# Patient Record
Sex: Female | Born: 1975 | Race: Black or African American | Hispanic: No | Marital: Married | Smoking: Never smoker
Health system: Southern US, Community
[De-identification: ages and names within clinical notes are randomized; demographics above are authoritative.]

## PROBLEM LIST (undated history)

## (undated) DIAGNOSIS — G35 Multiple sclerosis: Secondary | ICD-10-CM

## (undated) DIAGNOSIS — R011 Cardiac murmur, unspecified: Secondary | ICD-10-CM

## (undated) HISTORY — DX: Cardiac murmur, unspecified: R01.1

## (undated) HISTORY — DX: Multiple sclerosis: G35

## (undated) HISTORY — PX: BREAST SURGERY: SHX581

---

## 2008-04-08 ENCOUNTER — Encounter: Admission: RE | Admit: 2008-04-08 | Discharge: 2008-04-08 | Payer: Self-pay | Admitting: Family Medicine

## 2008-10-28 ENCOUNTER — Emergency Department (HOSPITAL_COMMUNITY): Admission: EM | Admit: 2008-10-28 | Discharge: 2008-10-28 | Payer: Self-pay | Admitting: Emergency Medicine

## 2009-12-22 ENCOUNTER — Emergency Department (HOSPITAL_COMMUNITY): Admission: EM | Admit: 2009-12-22 | Discharge: 2009-12-22 | Payer: Self-pay | Admitting: Emergency Medicine

## 2011-11-04 ENCOUNTER — Ambulatory Visit (HOSPITAL_COMMUNITY)
Admission: RE | Admit: 2011-11-04 | Discharge: 2011-11-04 | Disposition: A | Discharge status: Home / Self Care | Payer: BC Managed Care – PPO | Source: Ambulatory Visit | Attending: Emergency Medicine | Admitting: Emergency Medicine

## 2011-11-04 ENCOUNTER — Ambulatory Visit (INDEPENDENT_AMBULATORY_CARE_PROVIDER_SITE_OTHER): Payer: BC Managed Care – PPO | Admitting: Emergency Medicine

## 2011-11-04 VITALS — BP 129/87 | HR 71 | Temp 98.2°F | Resp 16 | Ht 64.25 in | Wt 145.2 lb

## 2011-11-04 DIAGNOSIS — R200 Anesthesia of skin: Secondary | ICD-10-CM

## 2011-11-04 DIAGNOSIS — R209 Unspecified disturbances of skin sensation: Secondary | ICD-10-CM

## 2011-11-04 LAB — COMPREHENSIVE METABOLIC PANEL
Albumin: 4.1 g/dL (ref 3.5–5.2)
BUN: 10 mg/dL (ref 6–23)
CO2: 26 mEq/L (ref 19–32)
Chloride: 105 mEq/L (ref 96–112)
Glucose, Bld: 90 mg/dL (ref 70–99)
Potassium: 3.5 mEq/L (ref 3.5–5.3)
Sodium: 139 mEq/L (ref 135–145)
Total Bilirubin: 1.3 mg/dL — ABNORMAL HIGH (ref 0.3–1.2)

## 2011-11-04 LAB — POCT CBC
Granulocyte percent: 48.2 %G (ref 37–80)
HCT, POC: 36.8 % — AB (ref 37.7–47.9)
Hemoglobin: 11.9 g/dL — AB (ref 12.2–16.2)
MCHC: 32.3 g/dL (ref 31.8–35.4)
MCV: 88.5 fL (ref 80–97)
MPV: 9.6 fL (ref 0–99.8)
POC LYMPH PERCENT: 42.8 %L (ref 10–50)
POC MID %: 9 %M (ref 0–12)

## 2011-11-04 LAB — TSH: TSH: 1.871 u[IU]/mL (ref 0.350–4.500)

## 2011-11-04 LAB — GLUCOSE, POCT (MANUAL RESULT ENTRY): POC Glucose: 82

## 2011-11-04 NOTE — Progress Notes (Signed)
Addended by: Lesle Chris A on: 11/04/2011 09:57 AM   Modules accepted: Orders

## 2011-11-04 NOTE — Progress Notes (Signed)
  Subjective:    Patient ID: Dawn Buckley, female    DOB: April 16, 1976, 36 y.o.   MRN: [id number]  HPI  patient enters with numbness which began in her left arm.She. Also noticed numbness on left side of her chest wall. She denies any weakness. She states she has not been having any headache double vision blurred vision facial asymmetry or other neurological symptoms. The left side of her abdomen also feels nunb.    Review of Systems      Objective:   Physical Exam  Constitutional: She appears well-developed and well-nourished.  Eyes: Pupils are equal, round, and reactive to light.  Neck: No thyromegaly present.  Cardiovascular: Normal rate.   Pulmonary/Chest: Effort normal and breath sounds normal.  Abdominal: She exhibits no distension.  Neurological: She is alert. She has normal reflexes. She displays normal reflexes. No cranial nerve deficit. She exhibits normal muscle tone. Coordination normal.       Patient has decreased sensation to touch which extends along the left arm left shoulder along the left lateral breast area and to the left side of the abdomen.  Skin: Skin is dry.   Results for orders placed in visit on 11/04/11  POCT CBC      Component Value Range   WBC 4.0 (*) 4.6 - 10.2 (K/uL)   Lymph, poc 1.7  0.6 - 3.4    POC LYMPH PERCENT 42.8  10 - 50 (%L)   MID (cbc) 0.4  0 - 0.9    POC MID % 9.0  0 - 12 (%M)   POC Granulocyte 1.9 (*) 2 - 6.9    Granulocyte percent 48.2  37 - 80 (%G)   RBC 4.16  4.04 - 5.48 (M/uL)   Hemoglobin 11.9 (*) 12.2 - 16.2 (g/dL)   HCT, POC 69.6 (*) 29.5 - 47.9 (%)   MCV 88.5  80 - 97 (fL)   MCH, POC 28.6  27 - 31.2 (pg)   MCHC 32.3  31.8 - 35.4 (g/dL)   RDW, POC 28.4     Platelet Count, POC 229  142 - 424 (K/uL)   MPV 9.6  0 - 99.8 (fL)  GLUCOSE, POCT (MANUAL RESULT ENTRY)      Component Value Range   POC Glucose 82         Assessment & Plan:     patient enters with left-sided numbness. She has numbness of her left arm and also left  side of the chest and abdomen. No other focal neurological findings were elicited. She has not had any pain and no rash making shingles unlikely. We'll proceed with routine blood work and scan.

## 2011-11-04 NOTE — Patient Instructions (Signed)
Please go to the [facility] for CT head. Please wait in x-ray for results of your scan.

## 2011-11-05 ENCOUNTER — Other Ambulatory Visit: Payer: Self-pay | Admitting: Emergency Medicine

## 2011-11-05 ENCOUNTER — Emergency Department (HOSPITAL_COMMUNITY): Payer: BC Managed Care – PPO

## 2011-11-05 ENCOUNTER — Encounter (HOSPITAL_COMMUNITY): Payer: Self-pay | Admitting: Emergency Medicine

## 2011-11-05 ENCOUNTER — Emergency Department (HOSPITAL_COMMUNITY)
Admission: EM | Admit: 2011-11-05 | Discharge: 2011-11-06 | Discharge status: Against Medical Advice | Payer: BC Managed Care – PPO | Attending: Emergency Medicine | Admitting: Emergency Medicine

## 2011-11-05 ENCOUNTER — Other Ambulatory Visit: Payer: Self-pay

## 2011-11-05 DIAGNOSIS — R209 Unspecified disturbances of skin sensation: Secondary | ICD-10-CM | POA: Insufficient documentation

## 2011-11-05 DIAGNOSIS — M25519 Pain in unspecified shoulder: Secondary | ICD-10-CM | POA: Insufficient documentation

## 2011-11-05 DIAGNOSIS — M79609 Pain in unspecified limb: Secondary | ICD-10-CM | POA: Insufficient documentation

## 2011-11-05 DIAGNOSIS — M79602 Pain in left arm: Secondary | ICD-10-CM

## 2011-11-05 LAB — CBC
HCT: 35.8 % — ABNORMAL LOW (ref 36.0–46.0)
MCHC: 35.5 g/dL (ref 30.0–36.0)
MCV: 84.6 fL (ref 78.0–100.0)
RBC: 4.23 MIL/uL (ref 3.87–5.11)

## 2011-11-05 LAB — DIFFERENTIAL
Basophils Relative: 1 % (ref 0–1)
Lymphocytes Relative: 44 % (ref 12–46)
Lymphs Abs: 2.6 10*3/uL (ref 0.7–4.0)
Neutro Abs: 2.8 10*3/uL (ref 1.7–7.7)
Neutrophils Relative %: 46 % (ref 43–77)

## 2011-11-05 LAB — BASIC METABOLIC PANEL
CO2: 27 mEq/L (ref 19–32)
Chloride: 103 mEq/L (ref 96–112)
Creatinine, Ser: 0.82 mg/dL (ref 0.50–1.10)
GFR calc non Af Amer: >90 mL/min (ref >90)
Glucose, Bld: 90 mg/dL (ref 70–99)

## 2011-11-05 LAB — URINALYSIS, ROUTINE W REFLEX MICROSCOPIC
Ketones, ur: NEGATIVE mg/dL
Leukocytes, UA: NEGATIVE

## 2011-11-05 LAB — POCT PREGNANCY, URINE: Preg Test, Ur: NEGATIVE

## 2011-11-05 LAB — URINE MICROSCOPIC-ADD ON

## 2011-11-05 MED ORDER — DIAZEPAM 5 MG/ML IJ SOLN: 5.0000 mg | Freq: Once | INTRAMUSCULAR | Status: DC

## 2011-11-05 NOTE — ED Provider Notes (Signed)
History     CSN: [id number]  Arrival date & time 11/05/11  1308   First MD Initiated Contact with Patient 11/05/11 2156      Chief Complaint  Patient presents with  . Arm Pain    (Consider location/radiation/quality/duration/timing/severity/associated sxs/prior treatment) HPI  Patient presents to the ED with complaints of right hand, arm, shoulder and rib numbness and tingling without pain for 3 days. The patient states that most of the symptoms have resolved but that her arm still feels tight. She denies doing any physical activity recently, or doing anything outside of the norm. The patient denies having neck pain, headache or chest pains. She states that she did have some hand pain but it was a shooting pain and that went away. She denies weakness in any of these extremities. She decided to come in today to get it looked at because she is unsure of what is causing her symptoms.   History reviewed. No pertinent past medical history.  History reviewed. No pertinent past surgical history.  No family history on file.  History  Substance Use Topics  . Smoking status: Never Smoker   . Smokeless tobacco: Not on file  . Alcohol Use: Not on file    OB History    Grav Para Term Preterm Abortions TAB SAB Ect Mult Living                  Review of Systems   HEENT: denies blurry vision or change in hearing PULMONARY: Denies difficulty breathing and SOB CARDIAC: denies chest pain or heart palpitations MUSCULOSKELETAL:  denies being unable to ambulate ABDOMEN AL: denies abdominal pain GU: denies loss of bowel or urinary control NEURO: denies weakness  Allergies  Codeine and Sulfa antibiotics  Home Medications   Current Outpatient Rx  Name Route Sig Dispense Refill  . ADULT MULTIVITAMIN W/MINERALS CH Oral Take 1 tablet by mouth daily.    Azzie Roup ACE-ETH ESTRAD-FE 1-20 MG-MCG PO TABS Oral Take 1 tablet by mouth daily.      BP 141/86  Pulse 74  Temp(Src) 98.7 F (37.1  C) (Oral)  Resp 18  SpO2 100%  LMP 10/15/2011  Physical Exam  Nursing note and vitals reviewed. Constitutional: She appears well-developed and well-nourished. No distress.  HENT:  Head: Normocephalic and atraumatic.  Eyes: Pupils are equal, round, and reactive to light.  Neck: Normal range of motion. Neck supple.  Cardiovascular: Normal rate and regular rhythm.   Pulmonary/Chest: Effort normal. Chest wall is not dull to percussion. She exhibits no mass, no tenderness, no laceration, no crepitus and no retraction.    Abdominal: Soft.  Musculoskeletal:       Left shoulder: She exhibits normal range of motion, no tenderness, no bony tenderness, no swelling, no effusion, no crepitus, no deformity, no laceration, no pain, no spasm, normal pulse and normal strength.        Equal strength to bilateral upper extremities. Neurosensory function adequate to both arms. Skin color is normal. Skin is warm and moist. I see no step off deformity in the neck, no bony tenderness. Pt is able to ambulate without limp.No crepitus, laceration, effusion, swelling.  Pulses are normal   Neurological: She is alert.  Skin: Skin is warm and dry.    ED Course  Procedures (including critical care time)  Labs Reviewed  URINALYSIS, ROUTINE W REFLEX MICROSCOPIC - Abnormal; Notable for the following:    APPearance TURBID (*)    All other components within normal  limits  CBC - Abnormal; Notable for the following:    HCT 35.8 (*)    All other components within normal limits  DIFFERENTIAL  BASIC METABOLIC PANEL  POCT PREGNANCY, URINE  URINE MICROSCOPIC-ADD ON   Ct Head Wo Contrast  11/04/2011  *RADIOLOGY REPORT*  Clinical Data: Left upper extremity and chest numbness, acute onset earlier today.  CT HEAD WITHOUT CONTRAST 11/04/2011:  Technique:  Contiguous axial images were obtained from the base of the skull through the vertex without contrast.  Comparison: None.  Findings: Ventricular system normal in size and  appearance for age. No mass lesion.  No midline shift.  No acute hemorrhage or hematoma.  No extra-axial fluid collections.  No evidence of acute infarction.  No focal brain parenchymal abnormalities.  No focal osseous abnormalities involving the skull.  Visualized paranasal sinuses, mastoid air cells, and middle ear cavities well- aerated.  IMPRESSION: Normal examination.  Original Report Authenticated By: Arnell Sieving, M.D.     No diagnosis found.    MDM   Patient left AMA after MD exam but before my examination was complete.  The patients work-up thus far was not concerning for cardiac etiology. Her symptoms seemingly were nerve related.          Dorthula Matas, PA 11/06/11 [phone]

## 2011-11-05 NOTE — ED Provider Notes (Signed)
9:36 PM  Date: 11/05/2011  Rate: 76  Rhythm: normal sinus rhythm  QRS Axis: normal  Intervals: normal  ST/T Wave abnormalities: normal  Conduction Disutrbances:none  Narrative Interpretation: Normal EKG.   Old EKG Reviewed: none available    Carleene Cooper III, MD 11/05/11 2137

## 2011-11-05 NOTE — ED Notes (Signed)
Troponin results 0.00 ng/mL (results not crossing over) B. Bing Plume, EMT

## 2011-11-05 NOTE — ED Notes (Signed)
PT. REPORTS LEFT ARM PAIN , LEFT HAND NUMBNESS AND LEFT ABDOMINAL " HEAVINESS' ONSET 2 DAYS AGO , NO  NAUSEA OR VOMITTING .

## 2011-11-05 NOTE — ED Notes (Signed)
Unable to locate patient during RN rounding

## 2011-11-06 ENCOUNTER — Telehealth: Payer: Self-pay

## 2011-11-06 LAB — POCT I-STAT TROPONIN I

## 2011-11-06 NOTE — Telephone Encounter (Signed)
Pt called to ask if her symptoms could be due to a recent insect bite. She has been experiencing numbness - i was working on her referral at the time and told her [facility] would be contacting her shortly to schedule her [place] and advised her to keep it especially if she is experiencing numbness. Pt understood but would still like a call about insect bite. bf

## 2011-11-06 NOTE — ED Notes (Signed)
Patient not in room

## 2011-11-06 NOTE — Telephone Encounter (Signed)
PT SAW DR DAUB AND WAS TO BE REFERRED FOR AN ULTRASOUND FOR ABDOMINAL PAIN, HOWEVER SHE IS HAVING ADDITIONAL SYSTEMS AND WOULD LIKE TO SPEAK TO CLINICAL STAFF ABOUT THESE NEW SYMPTOMS.

## 2011-11-06 NOTE — Telephone Encounter (Signed)
SPOKE WITH PT--SHE HAD ALREADY RESOLVED ISSUES.

## 2011-11-06 NOTE — ED Notes (Signed)
Unable to locate patient; informed Dr. Ignacia Palma & charge RN. Assuming patient left AMA.

## 2011-11-06 NOTE — ED Provider Notes (Signed)
Medical screening examination/treatment/procedure(s) were performed by non-physician practitioner and as supervising physician I was immediately available for consultation/collaboration.   Carleene Cooper III, MD 11/06/11 [phone]

## 2011-11-08 ENCOUNTER — Telehealth: Payer: Self-pay

## 2011-11-08 ENCOUNTER — Other Ambulatory Visit: Payer: BC Managed Care – PPO

## 2011-11-08 ENCOUNTER — Ambulatory Visit
Admission: RE | Admit: 2011-11-08 | Discharge: 2011-11-08 | Disposition: A | Discharge status: Home / Self Care | Payer: BC Managed Care – PPO | Source: Ambulatory Visit | Attending: Emergency Medicine | Admitting: Emergency Medicine

## 2011-11-08 NOTE — Telephone Encounter (Signed)
Pt is at [facility] having her ultrasound for her abdomen now, but she is calling asking if we could contact [facility] and see if she can be seen today instead of her appt on 11/19/11.  She is in a lot of pain and had severe pain in left arm yesterday and when she sets down experiencing heaviness in her back as well now-please call [phone] and ok to leave a message.  She did call [facility] on her own and they told her that we would have to call [phone]

## 2011-11-08 NOTE — Telephone Encounter (Signed)
Spoke with patient and notified below.  She will plan on coming in to recheck Sunday am first thing.  Appt with Neuro on 5/20.  Should we try to move up appt?  Pls advise.

## 2011-11-08 NOTE — Telephone Encounter (Signed)
Please call [facility] to check on this.

## 2011-11-08 NOTE — Telephone Encounter (Signed)
Pt called and I advised her that her scan was normal. She wanted to know the next step and I advised her Dr. Cleta Alberts didn't review her scan yet. Please review and advise what you would like me to tell pt.

## 2011-11-08 NOTE — Telephone Encounter (Signed)
Please call patient. I would like to reexamine her either Thursday or Friday. He may need to schedule an MRI which would be more sensitive to evaluate small lesions. Please also ask if she has heard from the neurology office about her appointment time

## 2011-11-09 ENCOUNTER — Ambulatory Visit: Payer: BC Managed Care – PPO

## 2011-11-09 ENCOUNTER — Ambulatory Visit (INDEPENDENT_AMBULATORY_CARE_PROVIDER_SITE_OTHER): Payer: BC Managed Care – PPO | Admitting: Emergency Medicine

## 2011-11-09 ENCOUNTER — Other Ambulatory Visit: Payer: Self-pay | Admitting: Emergency Medicine

## 2011-11-09 VITALS — BP 131/86 | HR 73 | Temp 98.2°F | Resp 16 | Ht 64.0 in | Wt 144.0 lb

## 2011-11-09 DIAGNOSIS — R200 Anesthesia of skin: Secondary | ICD-10-CM

## 2011-11-09 DIAGNOSIS — R209 Unspecified disturbances of skin sensation: Secondary | ICD-10-CM

## 2011-11-09 MED ORDER — ALPRAZOLAM 0.5 MG PO TABS: ORAL_TABLET | ORAL | Status: DC

## 2011-11-09 NOTE — Telephone Encounter (Signed)
Appt. Has already been rescheduled for 11/13/11 @ 1:30 pm,  Spoke to pt she is aware of appt. and msg.  The pain has now moved to her abdomen and back.  She is planning to be seen today by Dr. Cleta Alberts to follow up.

## 2011-11-09 NOTE — Telephone Encounter (Signed)
Please call [facility] . Please see if they can move up [name]'s appointment to the first of the week. Please also notify [name] I am scheduling her for an MRI of the neck to be sure there is no abnormality there causing the numbness to  her chest and arm.

## 2011-11-09 NOTE — Progress Notes (Signed)
  Subjective:    Patient ID: Dawn Buckley, female    DOB: April 28, 1976, 36 y.o.   MRN: [id number]  HPI patient presents for followup of left arm numbness and left abdominal wall numbness she had an emergent CT of the head done and this was mad at the she had a chest x-ray done and this was normal. She felt a fullness in her abdomen and subsequently had an ultrasound of the abdomen which was normal. Patient still has intermittent numbness in her arm. She feels her arm is cold at times been numb sensation she had on the left side of her abdomen has improved there she's had no headache dizzy spells fainting spells she is very concerned that she may have MS. She is scheduled for an MRI of the cervical spine on Sunday she is scheduled to see Dr. Genevive Bi neurologist on Tuesday.    Review of Systems noncontributory except as relates to these neurological symptoms.     Objective:   Physical Exam  Constitutional: She is oriented to person, place, and time. She appears well-developed and well-nourished.  HENT:  Head: Normocephalic.  Eyes: Pupils are equal, round, and reactive to light.  Neck: No thyromegaly present.  Cardiovascular: Normal rate and regular rhythm.   Pulmonary/Chest: Effort normal and breath sounds normal. No respiratory distress. She has no wheezes.  Abdominal: She exhibits no distension.  Neurological: She is alert and oriented to person, place, and time. She has normal reflexes. No cranial nerve deficit. She exhibits normal muscle tone. Coordination normal.   [facility] reading (PRIMARY) by  Dr.Dierks Wach Normal .        Assessment & Plan:     Patient is set up for an MRI of her neck on Sunday. She is scheduled to see Dr. Lucrezia Starch on Tuesday will go ahead and put her on a low dose Xanax to take for stress until she has her evaluation and x-rays.

## 2011-11-11 ENCOUNTER — Ambulatory Visit
Admission: RE | Admit: 2011-11-11 | Discharge: 2011-11-11 | Disposition: A | Discharge status: Home / Self Care | Payer: BC Managed Care – PPO | Source: Ambulatory Visit | Attending: Emergency Medicine | Admitting: Emergency Medicine

## 2011-11-11 DIAGNOSIS — R200 Anesthesia of skin: Secondary | ICD-10-CM

## 2011-11-12 ENCOUNTER — Telehealth: Payer: Self-pay | Admitting: Radiology

## 2011-11-12 NOTE — Telephone Encounter (Signed)
Message copied by Luretha Murphy on Mon Nov 12, 2011  4:39 PM ------      Message from: Lesle Chris A      Created: Mon Nov 12, 2011 10:14 AM       I called the patient with the test results. Please fax a copy of this to go for neurology today because patient has an appointment to be seen by the neurologist tomorrow. I have given results

## 2011-11-12 NOTE — Telephone Encounter (Signed)
Faxed copy of mri to [facility]

## 2011-12-26 ENCOUNTER — Ambulatory Visit (INDEPENDENT_AMBULATORY_CARE_PROVIDER_SITE_OTHER): Payer: BC Managed Care – PPO | Admitting: Family Medicine

## 2011-12-26 VITALS — BP 121/79 | HR 69 | Temp 98.1°F | Resp 16 | Ht 64.0 in | Wt 137.0 lb

## 2011-12-26 DIAGNOSIS — N390 Urinary tract infection, site not specified: Secondary | ICD-10-CM

## 2011-12-26 DIAGNOSIS — R300 Dysuria: Secondary | ICD-10-CM

## 2011-12-26 LAB — POCT URINALYSIS DIPSTICK
Glucose, UA: NEGATIVE
Nitrite, UA: NEGATIVE
Urobilinogen, UA: 0.2

## 2011-12-26 LAB — POCT UA - MICROSCOPIC ONLY
Casts, Ur, LPF, POC: NEGATIVE
Crystals, Ur, HPF, POC: NEGATIVE
Mucus, UA: NEGATIVE
Yeast, UA: NEGATIVE

## 2011-12-26 MED ORDER — CIPROFLOXACIN HCL 250 MG PO TABS: 250.0000 mg | ORAL_TABLET | Freq: Two times a day (BID) | ORAL | Status: DC

## 2011-12-26 NOTE — Patient Instructions (Addendum)

## 2011-12-26 NOTE — Progress Notes (Signed)
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 She's had no history of UTI in the past. She's had no fever, nausea, vomiting, CVA tenderness.  Patient was recently diagnosed with multiple sclerosis and is given a prescription for this but has not started it as of yet. Her symptoms were that were numbness in her left hand. She was diagnosed with an MRI  Objective: Results for orders placed in visit on 12/26/11  POCT UA - MICROSCOPIC ONLY      Component Value Range   WBC, Ur, HPF, POC tntc     RBC, urine, microscopic 2-5     Bacteria, U Microscopic 4+     Mucus, UA neg     Epithelial cells, urine per micros 0-1     Crystals, Ur, HPF, POC neg     Casts, Ur, LPF, POC neg     Yeast, UA neg    POCT URINALYSIS DIPSTICK      Component Value Range   Color, UA yellow     Clarity, UA cloudy     Glucose, UA neg     Bilirubin, UA neg     Ketones, UA neg     Spec Grav, UA 1.010     Blood, UA large     pH, UA 7.0     Protein, UA 30mg      Urobilinogen, UA 0.2     Nitrite, UA neg     Leukocytes, UA large (3+)     No CVA tenderness Patient alert cooperative and well-informed  Assessment: Simple uncomplicated UTI  Plan: Urine culture, Cipro 250 twice a day x3 days, cranberry pills for prevention and patient education materials provided the patient

## 2011-12-28 ENCOUNTER — Telehealth: Payer: Self-pay

## 2011-12-28 ENCOUNTER — Other Ambulatory Visit: Payer: Self-pay | Admitting: Family Medicine

## 2011-12-29 LAB — URINE CULTURE: Colony Count: 15000

## 2012-01-11 ENCOUNTER — Ambulatory Visit (INDEPENDENT_AMBULATORY_CARE_PROVIDER_SITE_OTHER): Payer: BC Managed Care – PPO | Admitting: Internal Medicine

## 2012-01-11 ENCOUNTER — Ambulatory Visit: Payer: BC Managed Care – PPO

## 2012-01-11 VITALS — BP 132/74 | HR 70 | Temp 98.5°F | Resp 17 | Ht 64.0 in | Wt 138.0 lb

## 2012-01-11 DIAGNOSIS — M25512 Pain in left shoulder: Secondary | ICD-10-CM

## 2012-01-11 DIAGNOSIS — M25519 Pain in unspecified shoulder: Secondary | ICD-10-CM

## 2012-01-11 DIAGNOSIS — M25562 Pain in left knee: Secondary | ICD-10-CM

## 2012-01-11 DIAGNOSIS — M25569 Pain in unspecified knee: Secondary | ICD-10-CM

## 2012-01-11 MED ORDER — MELOXICAM 15 MG PO TABS: 15.0000 mg | ORAL_TABLET | Freq: Every day | ORAL | Status: DC

## 2012-01-11 NOTE — Progress Notes (Signed)
  Subjective:    Patient ID: Dawn Buckley, female    DOB: [birth year], 36 y.o.   MRN: [id number]  HPISlipped on a wet area in a restaurant earlier today landing on her left side Had immediate pain in the left shoulder and left knee and around the left hip Now has to limp on the left leg although shoulder seems to have improved somewhat and he appears resolved No prior injuries in these areas    Review of Systems     Objective:   Physical Exam In no acute distress Vital signs stable Left shoulder has good range of motion with abduction full and very little pain with resisted movement She is tender over the anterior shoulder and the deltoid to the clavicle and scapula are intact The left hip is stable The left knee has no effusion Tender palpation over the lateral collateral ligament and over the lateral popliteal fossa and very mildly over the medial aspect of the patellar Patella [place] freely Valgus and varus are stable with slight discomfort drawer stable/mcMurray's negative       Assessment & Plan:  [facility] reading (PRIMARY) by  Dr. Merla Riches no fx or disloc  Problem #1 knee pain caused by twisting injury Problem #2 shoulder contusion  Advise rest and ice for the knee with meloxicam daily She should improve steadily over the next 2 weeks until fully normal and if not will return for evaluation She will return if she develops effusion tomorrow The shoulder should resolve without treatment

## 2012-04-21 ENCOUNTER — Ambulatory Visit (INDEPENDENT_AMBULATORY_CARE_PROVIDER_SITE_OTHER): Payer: BC Managed Care – PPO | Admitting: Physician Assistant

## 2012-04-21 VITALS — BP 102/78 | HR 82 | Temp 98.7°F | Resp 16 | Ht 64.5 in | Wt 137.0 lb

## 2012-04-21 DIAGNOSIS — IMO0001 Reserved for inherently not codable concepts without codable children: Secondary | ICD-10-CM

## 2012-04-21 DIAGNOSIS — R509 Fever, unspecified: Secondary | ICD-10-CM

## 2012-04-21 DIAGNOSIS — M791 Myalgia, unspecified site: Secondary | ICD-10-CM

## 2012-04-21 DIAGNOSIS — G35 Multiple sclerosis: Secondary | ICD-10-CM | POA: Insufficient documentation

## 2012-04-21 LAB — POCT CBC
Granulocyte percent: 79.5 %G (ref 37–80)
HCT, POC: 41 % (ref 37.7–47.9)
Hemoglobin: 12.5 g/dL (ref 12.2–16.2)
MCV: 90.8 fL (ref 80–97)
POC Granulocyte: 6.6 (ref 2–6.9)
POC LYMPH PERCENT: 13.5 %L (ref 10–50)
RBC: 4.52 M/uL (ref 4.04–5.48)
RDW, POC: 12.8 %

## 2012-04-21 LAB — POCT INFLUENZA A/B
Influenza A, POC: NEGATIVE
Influenza B, POC: NEGATIVE

## 2012-04-21 NOTE — Patient Instructions (Signed)
Influenza-like Illnesses can feel just as bad as "The Flu." Get plenty of rest and drink at least 64 ounces of water daily.

## 2012-04-21 NOTE — Progress Notes (Signed)
  Subjective:    Patient ID: Dawn Buckley, female    DOB: 31-Jan-1976, 36 y.o.   MRN: [id number]  HPI This 36 y.o. female presents for evaluation of flu-like illness.  Symptoms began 04/19/2012, but got much worse yesterday.  Fever, chills, muscle aches all over, sore throat.  No other URI-type symptoms.  Normal GU/GI.  No flu vaccine this season.  Review of Systems As above. No rash.  No CP, SOB, HA, dizziness.    Objective:   Physical Exam  Blood pressure 102/78, pulse 82, temperature 98.7 F (37.1 C), temperature source Oral, resp. rate 16, height 5' 4.5" (1.638 m), weight 137 lb (62.143 kg), SpO2 100.00%. Body mass index is 23.15 kg/(m^2). Well-developed, well nourished BF who is awake, alert and oriented, in NAD. HEENT: [place]/AT, PERRL, EOMI.  Sclera and conjunctiva are clear.  EAC are patent, TMs are normal in appearance. Nasal mucosa is pink and moist. OP is clear. Neck: supple, non-tender, no lymphadenopathy, thyromegaly. Heart: RRR, no murmur Lungs: normal effort, CTA Extremities: no cyanosis, clubbing or edema. Skin: warm and dry without rash. Psychologic: good mood and appropriate affect, normal speech and behavior.  Results for orders placed in visit on 04/21/12  POCT INFLUENZA A/B      Component Value Range   Influenza A, POC Negative     Influenza B, POC Negative    POCT CBC      Component Value Range   WBC 8.3  4.6 - 10.2 K/uL   Lymph, poc 1.1  0.6 - 3.4   POC LYMPH PERCENT 13.5  10 - 50 %L   MID (cbc) 0.6  0 - 0.9   POC MID % 7.0  0 - 12 %M   POC Granulocyte 6.6  2 - 6.9   Granulocyte percent 79.5  37 - 80 %G   RBC 4.52  4.04 - 5.48 M/uL   Hemoglobin 12.5  12.2 - 16.2 g/dL   HCT, POC 40.9  81.1 - 47.9 %   MCV 90.8  80 - 97 fL   MCH, POC 27.7  27 - 31.2 pg   MCHC 30.5 (*) 31.8 - 35.4 g/dL   RDW, POC 91.4     Platelet Count, POC 246  142 - 424 K/uL   MPV 9.6  0 - 99.8 fL        Assessment & Plan:   1. Muscle pain  POCT Influenza A/B, POCT CBC  2. Fever   POCT Influenza A/B, POCT CBC   Supportive care.  Anticipatory guidance.  RTC if symptoms worsen, persist. Note for work.

## 2012-06-05 NOTE — Telephone Encounter (Signed)
No message

## 2012-07-13 ENCOUNTER — Encounter (HOSPITAL_COMMUNITY): Payer: Self-pay | Admitting: Emergency Medicine

## 2012-07-13 ENCOUNTER — Emergency Department (HOSPITAL_COMMUNITY)
Admission: EM | Admit: 2012-07-13 | Discharge: 2012-07-13 | Disposition: A | Discharge status: Home / Self Care | Payer: BC Managed Care – PPO | Attending: Emergency Medicine | Admitting: Emergency Medicine

## 2012-07-13 ENCOUNTER — Emergency Department (HOSPITAL_COMMUNITY): Payer: BC Managed Care – PPO

## 2012-07-13 DIAGNOSIS — Z79899 Other long term (current) drug therapy: Secondary | ICD-10-CM | POA: Insufficient documentation

## 2012-07-13 DIAGNOSIS — Y92410 Unspecified street and highway as the place of occurrence of the external cause: Secondary | ICD-10-CM | POA: Insufficient documentation

## 2012-07-13 DIAGNOSIS — S199XXA Unspecified injury of neck, initial encounter: Secondary | ICD-10-CM | POA: Insufficient documentation

## 2012-07-13 DIAGNOSIS — S0993XA Unspecified injury of face, initial encounter: Secondary | ICD-10-CM | POA: Insufficient documentation

## 2012-07-13 DIAGNOSIS — R011 Cardiac murmur, unspecified: Secondary | ICD-10-CM | POA: Insufficient documentation

## 2012-07-13 DIAGNOSIS — G35 Multiple sclerosis: Secondary | ICD-10-CM | POA: Insufficient documentation

## 2012-07-13 DIAGNOSIS — IMO0002 Reserved for concepts with insufficient information to code with codable children: Secondary | ICD-10-CM | POA: Insufficient documentation

## 2012-07-13 DIAGNOSIS — S0990XA Unspecified injury of head, initial encounter: Secondary | ICD-10-CM | POA: Insufficient documentation

## 2012-07-13 DIAGNOSIS — Y9389 Activity, other specified: Secondary | ICD-10-CM | POA: Insufficient documentation

## 2012-07-13 MED ORDER — CYCLOBENZAPRINE HCL 10 MG PO TABS: 10.0000 mg | ORAL_TABLET | Freq: Two times a day (BID) | ORAL | Status: DC | PRN

## 2012-07-13 MED ORDER — TRAMADOL HCL 50 MG PO TABS: 50.0000 mg | ORAL_TABLET | Freq: Four times a day (QID) | ORAL | Status: DC | PRN

## 2012-07-13 MED ORDER — TRAMADOL HCL 50 MG PO TABS
50.0000 mg | ORAL_TABLET | Freq: Once | ORAL | Status: AC
  Administered 2012-07-13: 50 mg via ORAL
  Filled 2012-07-13: qty 1
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
## 2012-07-14 NOTE — ED Provider Notes (Signed)
Medical screening examination/treatment/procedure(s) were performed by non-physician practitioner and as supervising physician I was immediately available for consultation/collaboration.   Gwyneth Sprout, MD 07/14/12 1241

## 2012-09-19 ENCOUNTER — Telehealth: Payer: Self-pay | Admitting: Diagnostic Neuroimaging

## 2012-09-19 MED ORDER — METHYLPREDNISOLONE (PAK) 4 MG PO TABS: ORAL_TABLET | ORAL | Status: DC

## 2012-09-19 NOTE — Telephone Encounter (Signed)
Pt called and is having since Wednesday, R leg stiffness, groin pain, L thigh soreness, is hard to move around.  She took ibuprofen , improved then got worse again.  Please advise.  She is MS pt, on copaxone.  Did nothing physical that would cause sx.

## 2012-09-19 NOTE — Addendum Note (Signed)
Addended by: Levert Feinstein on: 09/19/2012 03:57 PM   Modules accepted: Orders

## 2012-09-19 NOTE — Telephone Encounter (Signed)
I have called her, she complains of right leg stiffness, pain, achy pain, difficulty walking, since Wed 19th, is progressive worse, she is still ambulatory, no bowel , bladder incontinence, no numbness, She is taking copaxone 6 months, she denies fever, URI.   Medrol pack now, call back next week

## 2012-12-29 ENCOUNTER — Telehealth: Payer: Self-pay | Admitting: Diagnostic Neuroimaging

## 2012-12-30 NOTE — Telephone Encounter (Signed)
Patient requesting order for 6 month MRI.

## 2013-01-07 NOTE — Telephone Encounter (Signed)
I called pt and she will come in on 01-12-13 and see LLAM/ NP for 6 mo RV.   Will then make decision re: MRI.

## 2013-01-12 ENCOUNTER — Ambulatory Visit (INDEPENDENT_AMBULATORY_CARE_PROVIDER_SITE_OTHER): Payer: BC Managed Care – PPO | Admitting: Nurse Practitioner

## 2013-01-12 ENCOUNTER — Encounter: Payer: Self-pay | Admitting: Nurse Practitioner

## 2013-01-12 VITALS — BP 120/81 | HR 70 | Temp 98.8°F | Ht 64.0 in | Wt 152.0 lb

## 2013-01-12 DIAGNOSIS — G35 Multiple sclerosis: Secondary | ICD-10-CM
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
## 2013-01-12 NOTE — Progress Notes (Signed)
I reviewed note and agree with plan.   Suanne Marker, MD 01/12/2013, 5:13 PM Certified in Neurology, Neurophysiology and Neuroimaging  [facility] [address] [place], [place] [address] [phone]

## 2013-01-12 NOTE — Patient Instructions (Addendum)
Continue Copaxone.  Follow up visit in 6 months.

## 2013-01-26 ENCOUNTER — Telehealth: Payer: Self-pay | Admitting: Diagnostic Neuroimaging

## 2013-01-26 NOTE — Telephone Encounter (Signed)
Setup with Larita Fife tomorrow AM. -VRP

## 2013-01-26 NOTE — Telephone Encounter (Signed)
Patient is still complaining of very bad headaches. She was just seen in the office for an office visit and would like to be seen again. Please advise.

## 2013-01-27 ENCOUNTER — Encounter: Payer: Self-pay | Admitting: Nurse Practitioner

## 2013-01-27 ENCOUNTER — Ambulatory Visit (INDEPENDENT_AMBULATORY_CARE_PROVIDER_SITE_OTHER): Payer: BC Managed Care – PPO | Admitting: Nurse Practitioner

## 2013-01-27 ENCOUNTER — Encounter: Payer: Self-pay | Admitting: *Deleted

## 2013-01-27 VITALS — BP 128/85 | HR 72 | Ht 64.0 in | Wt 150.0 lb

## 2013-01-27 DIAGNOSIS — G35D Multiple sclerosis, unspecified: Secondary | ICD-10-CM

## 2013-01-27 DIAGNOSIS — R519 Headache, unspecified: Secondary | ICD-10-CM

## 2013-01-27 DIAGNOSIS — G35 Multiple sclerosis: Secondary | ICD-10-CM

## 2013-01-27 DIAGNOSIS — R51 Headache: Secondary | ICD-10-CM

## 2013-01-27 NOTE — Patient Instructions (Addendum)
Repeat MRI brain with and without contrast.  May use Ibuprofen prn for headaches, try not to use anything for mild headaches to prevent "medication rebound" headache.  Follow up 6 months.
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
## 2013-01-28 NOTE — Progress Notes (Signed)
I reviewed note and agree with plan.  I also examined patient and discussed plan with her.  Suanne Marker, MD 01/28/2013, 6:29 PM Certified in Neurology, Neurophysiology and Neuroimaging  [facility] [address] [place], [place] [address] [phone]

## 2013-01-30 NOTE — Telephone Encounter (Signed)
This encounter was created in error - please disregard.

## 2013-02-04 ENCOUNTER — Ambulatory Visit (INDEPENDENT_AMBULATORY_CARE_PROVIDER_SITE_OTHER): Payer: BC Managed Care – PPO

## 2013-02-04 DIAGNOSIS — R51 Headache: Secondary | ICD-10-CM

## 2013-02-04 DIAGNOSIS — G35 Multiple sclerosis: Secondary | ICD-10-CM

## 2013-02-04 DIAGNOSIS — R519 Headache, unspecified: Secondary | ICD-10-CM

## 2013-02-05 MED ORDER — GADOPENTETATE DIMEGLUMINE 469.01 MG/ML IV SOLN: 14.0000 mL | Freq: Once | INTRAVENOUS | Status: AC | PRN

## 2013-02-06 ENCOUNTER — Telehealth: Payer: Self-pay | Admitting: Diagnostic Neuroimaging

## 2013-02-09 NOTE — Telephone Encounter (Signed)
Called patient gave MRI results. Patient says she has some additional questions for Dr. Marjory Lies. Next OV is not until 07/2013. Request call.

## 2013-02-11 ENCOUNTER — Other Ambulatory Visit: Payer: Self-pay

## 2013-02-11 MED ORDER — GLATIRAMER ACETATE 20 MG/ML ~~LOC~~ KIT: 20.0000 mg | PACK | Freq: Every day | SUBCUTANEOUS | Status: DC

## 2013-02-12 NOTE — Telephone Encounter (Signed)
I called pt. Reviewed results. Continue current medications. -VRP

## 2013-03-05 ENCOUNTER — Ambulatory Visit (INDEPENDENT_AMBULATORY_CARE_PROVIDER_SITE_OTHER): Payer: BC Managed Care – PPO | Admitting: Physician Assistant

## 2013-03-05 ENCOUNTER — Ambulatory Visit: Payer: BC Managed Care – PPO

## 2013-03-05 VITALS — BP 122/80 | HR 82 | Temp 98.5°F | Resp 18 | Ht 64.5 in | Wt 150.0 lb

## 2013-03-05 DIAGNOSIS — R05 Cough: Secondary | ICD-10-CM

## 2013-03-05 DIAGNOSIS — R059 Cough, unspecified: Secondary | ICD-10-CM

## 2013-03-05 DIAGNOSIS — J40 Bronchitis, not specified as acute or chronic: Secondary | ICD-10-CM

## 2013-03-05 MED ORDER — AZITHROMYCIN 250 MG PO TABS: ORAL_TABLET | ORAL | Status: DC

## 2013-03-05 MED ORDER — ALBUTEROL SULFATE HFA 108 (90 BASE) MCG/ACT IN AERS: 2.0000 | INHALATION_SPRAY | RESPIRATORY_TRACT | Status: DC | PRN

## 2013-03-05 MED ORDER — HYDROCODONE-HOMATROPINE 5-1.5 MG/5ML PO SYRP: ORAL_SOLUTION | ORAL | Status: DC

## 2013-03-05 MED ORDER — PREDNISONE 20 MG PO TABS: ORAL_TABLET | ORAL | Status: DC

## 2013-03-05 NOTE — Progress Notes (Signed)
Patient ID: Dawn Buckley MRN: [id number], DOB: 1975/07/07, 37 y.o. Date of Encounter: 03/05/2013, 9:41 AM  Primary Physician: Lucilla Edin, MD  Chief Complaint:  Chief Complaint  Patient presents with  . Cough    * 1 week- worse *2 days  . Wheezing    at night  . Shortness of Breath    HPI: 37 y.o. female presents with a 7 day history of cough and a 1 day history of SOB, wheezing, and worsening of the cough.  Afebrile. Cough is productive of yellow to clear sputum and not associated with time of day. Has been getting into coughing episodes this past day. No nasal congestion, rhinorrhea, sinus pressure, otalgia, headache, or post nasal drip. Has tried OTC cold preps without much success. No GI complaints. Appetite slightly decreased. Son has been sick with a cough recently as well. Remote history of asthma with previous URI.   No recent antibiotics, or recent travels.   No leg trauma, sedentary periods, h/o cancer, or tobacco use.  Past Medical History  Diagnosis Date  . Heart murmur   . MS (multiple sclerosis)      Home Meds: Prior to Admission medications   Medication Sig Start Date End Date Taking? Authorizing Provider  B Complex-C-Folic Acid (B COMPLEX-VITAMIN C-FOLIC ACID) 1 MG tablet Take 1 tablet by mouth daily with breakfast.   Yes Historical Provider, MD  cholecalciferol (VITAMIN D) 1000 UNITS tablet Take 1,000 Units by mouth daily.   Yes Historical Provider, MD  desogestrel-ethinyl estradiol (KARIVA,AZURETTE,MIRCETTE) 0.15-0.02/0.01 MG (21/5) tablet Take 1 tablet by mouth daily.   Yes Historical Provider, MD  glatiramer (COPAXONE) 20 MG/ML injection Inject 1 mL (20 mg total) into the skin daily. 02/11/13  Yes Suanne Marker, MD  Multiple Vitamin (MULITIVITAMIN WITH MINERALS) TABS Take 1 tablet by mouth daily.   Yes Historical Provider, MD  Omega-3 Fatty Acids (FISH OIL) 300 MG CAPS Take 300 mg by mouth 3 (three) times daily.   Yes Historical Provider, MD  vitamin  B-12 (CYANOCOBALAMIN) 1000 MCG tablet Take 1,000 mcg by mouth daily.   Yes Historical Provider, MD  Methylsulfonylmethane (MSM) 1000 MG TABS Take 3 tablets by mouth daily.    Historical Provider, MD    Allergies:  Allergies  Allergen Reactions  . Codeine Nausea And Vomiting  . Sulfa Antibiotics Nausea And Vomiting    History   Social History  . Marital Status: Married    Spouse Name: Sherilyn Cooter    Number of Children: 2  . 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 Smoking status: Never Smoker   . Smokeless tobacco: Never Used  . Alcohol Use: No  . Drug Use: No  . Sexual Activity: Yes    Partners: Male    Birth Control/ Protection: Pill   Other Topics Concern  . Not on file   Social History Narrative   Lives with husband and 2 children.   Caffeine Use: none     Review of Systems: Constitutional: positive for fatigue. negative for chills, fever HEENT: see above Cardiovascular: negative for chest pain or palpitations Respiratory: positive for cough, SOB, and wheezing Abdominal: negative for abdominal pain, nausea, vomiting or diarrhea Dermatological: negative for rash Neurologic: negative for headache   Physical Exam: Blood pressure 122/80, pulse 82, temperature 98.5 F (36.9 C), temperature source Oral, resp. rate 18, height 5' 4.5" (1.638  m), weight 150 lb (68.04 kg), SpO2 99.00%., Body mass index is 25.36 kg/(m^2). General: Well developed, well nourished, in no acute distress. Head: Normocephalic, atraumatic, eyes without discharge, sclera non-icteric, nares are congested. Bilateral auditory canals clear, TM's are without perforation, pearly grey with reflective cone of light bilaterally. No sinus TTP. Oral cavity moist, dentition normal. Posterior pharynx with post nasal drip and mild erythema. No peritonsillar abscess or tonsillar exudate. Uvula midline.  Neck: Supple. No thyromegaly.  Full ROM. No lymphadenopathy. Lungs: Coarse breath sounds bilaterally without wheezes, rales, or rhonchi. Breathing is unlabored.  Heart: RRR with S1 S2. No murmurs, rubs, or gallops appreciated. Msk:  Strength and tone normal for age. Extremities: No clubbing or cyanosis. No edema. Neuro: Alert and oriented X 3. Moves all extremities spontaneously. CNII-XII grossly in tact. Psych:  Responds to questions appropriately with a normal affect.   CXR: [facility] reading (PRIMARY) by  Dr. Patsy Lager. Slightly increased markings right perihilar. No discrete infiltrate.    ASSESSMENT AND PLAN:  37 y.o. female with bronchitis and cough.  -Azithromycin 250 MG #6 2 po first day then 1 po next 4 days no RF -Hycodan #4oz 1 tsp po q 4-6 hours prn cough no RF SED -Prednisone 20 mg #12 3x2, 2x2, 1x2 no RF -Proventil 2 puffs inhaled q 4-6 hours prn #1 no RF -Mucinex -Tylenol/Motrin prn -Rest/fluids -RTC precautions -RTC 3-5 days if no improvement  Signed, Eula Listen, PA-C 03/05/2013 9:41 AM

## 2013-04-11 ENCOUNTER — Emergency Department (HOSPITAL_COMMUNITY)
Admission: EM | Admit: 2013-04-11 | Discharge: 2013-04-11 | Disposition: A | Discharge status: Home / Self Care | Payer: BC Managed Care – PPO | Attending: Emergency Medicine | Admitting: Emergency Medicine

## 2013-04-11 ENCOUNTER — Telehealth (HOSPITAL_COMMUNITY): Payer: Self-pay | Admitting: *Deleted

## 2013-04-11 ENCOUNTER — Emergency Department (HOSPITAL_COMMUNITY): Payer: BC Managed Care – PPO

## 2013-04-11 ENCOUNTER — Encounter (HOSPITAL_COMMUNITY): Payer: Self-pay | Admitting: Emergency Medicine

## 2013-04-11 DIAGNOSIS — Z8701 Personal history of pneumonia (recurrent): Secondary | ICD-10-CM | POA: Insufficient documentation

## 2013-04-11 DIAGNOSIS — Z8669 Personal history of other diseases of the nervous system and sense organs: Secondary | ICD-10-CM | POA: Insufficient documentation

## 2013-04-11 DIAGNOSIS — Z79899 Other long term (current) drug therapy: Secondary | ICD-10-CM | POA: Insufficient documentation

## 2013-04-11 DIAGNOSIS — R011 Cardiac murmur, unspecified: Secondary | ICD-10-CM | POA: Insufficient documentation

## 2013-04-11 DIAGNOSIS — B779 Ascariasis, unspecified: Secondary | ICD-10-CM | POA: Insufficient documentation

## 2013-04-11 MED ORDER — MEBENDAZOLE 100 MG PO CHEW: 100.0000 mg | CHEWABLE_TABLET | Freq: Two times a day (BID) | ORAL | Status: DC

## 2013-04-11 NOTE — ED Provider Notes (Signed)
CSN: [id number]     Arrival date & time 04/11/13  1848 History  This chart was scribed for non-physician practitioner, Dierdre Forth, PA-C,working with Vida Roller, MD, by Karle Plumber, ED Scribe.  This patient was seen in room WTR1/WLPT1 and the patient's care was started at 7:05 PM.  No chief complaint on file.   The history is provided by the patient. No language interpreter was used.   HPI Comments:  Yudith Norlander is a 37 y.o. female who presents to the Emergency Department complaining of a rectal worm. Pt states she felt something moving around her rectum onset yesterday when she reached back and pulled two worms out. She denies any recent travel or any different foods. She states she eats meat daily and believes that to have been the cause. Pt denies any fevers, SOB, wheezing, cough, nausea, vomiting, or diarrhea. She denies all other pertinent symptoms.  Patient reports that she had pneumonia last month, she also has a history of MS,  but is otherwise well..   Past Medical History  Diagnosis Date  . Heart murmur   . MS (multiple sclerosis)    Past Surgical History  Procedure Laterality Date  . Breast surgery     Family History  Problem Relation Age of Onset  . Hypertension Mother   . Hypertension Father   . Kidney disease Sister     medication induced  . COPD Sister     emphysema  . Hypertension Sister   . Hypertension Brother    History  Substance Use Topics  . Smoking status: Never Smoker   . Smokeless tobacco: Never Used  . Alcohol Use: No   OB History   Grav Para Term Preterm Abortions TAB SAB Ect Mult Living   2 2 1 1  0 0 0 0 0 2     Review of Systems  Constitutional: Negative for fever, chills, diaphoresis, appetite change, fatigue and unexpected weight change.  HENT: Negative for mouth sores.   Eyes: Negative for visual disturbance.  Respiratory: Negative for cough, chest tightness, shortness of breath and wheezing.   Cardiovascular:  Negative for chest pain.  Gastrointestinal: Negative for nausea, vomiting, abdominal pain, diarrhea, constipation and rectal pain.  Endocrine: Negative for polydipsia, polyphagia and polyuria.  Genitourinary: Negative for dysuria, urgency, frequency and hematuria.  Musculoskeletal: Negative for back pain and neck stiffness.  Skin: Negative for rash.  Allergic/Immunologic: Negative for immunocompromised state.  Neurological: Negative for syncope, light-headedness and headaches.  Hematological: Does not bruise/bleed easily.  Psychiatric/Behavioral: Negative for sleep disturbance. The patient is not nervous/anxious.     Allergies  Codeine and Sulfa antibiotics  Home Medications   Current Outpatient Rx  Name  Route  Sig  Dispense  Refill  . albuterol (PROVENTIL HFA;VENTOLIN HFA) 108 (90 BASE) MCG/ACT inhaler   Inhalation   Inhale 2 puffs into the lungs every 4 (four) hours as needed for wheezing.   1 Inhaler   1   . azithromycin (ZITHROMAX Z-PAK) 250 MG tablet      2 tabs po first day, then 1 tab po next 4 days   6 tablet   0   . B Complex-C-Folic Acid (B COMPLEX-VITAMIN C-FOLIC ACID) 1 MG tablet   Oral   Take 1 tablet by mouth daily with breakfast.         . cholecalciferol (VITAMIN D) 1000 UNITS tablet   Oral   Take 1,000 Units by mouth daily.         Marland Kitchen  desogestrel-ethinyl estradiol (KARIVA,AZURETTE,MIRCETTE) 0.15-0.02/0.01 MG (21/5) tablet   Oral   Take 1 tablet by mouth daily.         Marland Kitchen glatiramer (COPAXONE) 20 MG/ML injection   Subcutaneous   Inject 1 mL (20 mg total) into the skin daily.   1 kit   6     ICD 340 Multiple Sclerosis   . HYDROcodone-homatropine (HYCODAN) 5-1.5 MG/5ML syrup      1 TSP PO Q 4-6 HOURS PRN COUGH   120 mL   0   . Methylsulfonylmethane (MSM) 1000 MG TABS   Oral   Take 3 tablets by mouth daily.         . Multiple Vitamin (MULITIVITAMIN WITH MINERALS) TABS   Oral   Take 1 tablet by mouth daily.         . Omega-3 Fatty  Acids (FISH OIL) 300 MG CAPS   Oral   Take 300 mg by mouth 3 (three) times daily.         . predniSONE (DELTASONE) 20 MG tablet      3 PO FOR 2 DAYS, 2 PO FOR 2 DAYS, 1 PO FOR 2 DAYS   12 tablet   0   . vitamin B-12 (CYANOCOBALAMIN) 1000 MCG tablet   Oral   Take 1,000 mcg by mouth daily.          Triage Vitals: BP 143/88  Pulse 77  Temp(Src) 97.3 F (36.3 C) (Oral)  Resp 17  SpO2 100% Physical Exam  Nursing note and vitals reviewed. Constitutional: She is oriented to person, place, and time. She appears well-developed and well-nourished. No distress.  Awake, alert, nontoxic appearance  HENT:  Head: Normocephalic and atraumatic.  Mouth/Throat: Oropharynx is clear and moist. No oropharyngeal exudate.  Eyes: Conjunctivae are normal. Pupils are equal, round, and reactive to light. No scleral icterus.  Neck: Normal range of motion. Neck supple.  Cardiovascular: Normal rate, regular rhythm and intact distal pulses.   Pulmonary/Chest: Effort normal and breath sounds normal. No respiratory distress. She has no wheezes.  Abdominal: Soft. Bowel sounds are normal. She exhibits no distension. There is no tenderness. There is no rebound.  Musculoskeletal: Normal range of motion. She exhibits no edema.  Lymphadenopathy:    She has no cervical adenopathy.  Neurological: She is alert and oriented to person, place, and time. She exhibits normal muscle tone. Coordination normal.  Speech is clear and goal oriented Moves extremities without ataxia  Skin: Skin is warm and dry. No rash noted. She is not diaphoretic. No erythema.  Psychiatric: Her mood appears anxious.  Pt tearful and worried    ED Course  Procedures (including critical care time) DIAGNOSTIC STUDIES: Oxygen Saturation is 100% on RA, normal by my interpretation.   COORDINATION OF CARE: 7:12 PM- Will obtain a CXR to r/o infiltrates of the worm and give prescription for Mebendazole. Pt verbalizes understanding and agrees  to plan.  Medications - No data to display  Labs Review Labs Reviewed - No data to display Imaging Review Dg Chest 2 View  04/11/2013   CLINICAL DATA:  Roundworm infection.  EXAM: CHEST  2 VIEW  COMPARISON:  March 05, 2013.  FINDINGS: The heart size and mediastinal contours are within normal limits. Both lungs are clear. The visualized skeletal structures are unremarkable.  IMPRESSION: No active cardiopulmonary disease.   Electronically Signed   By: Roque Lias M.D.   On: 04/11/2013 19:28    EKG Interpretation   None  MDM  No diagnosis found.  Mellody Dance presents emergency department complaining of rectal worms.  Patient has brought a sample of the worms with her.  Worms identified as round worms.  Patient without further somatic symptoms. As roundworm larva enter through the lungs and often cause pneumonia I will check a chest x-ray.  Chest x-ray without evidence of pneumonia.  I personally reviewed the imaging tests through PACS system.  I reviewed available ER/hospitalization records through the EMR.  Last month's pneumonia was likely linked to round worms.  Patient will be given for mebendazole 100 mg twice a day for 3 days. Recommended close followup with her primary care physician.  It has been determined that no acute conditions requiring further emergency intervention are present at this time. The patient/guardian have been advised of the diagnosis and plan. We have discussed signs and symptoms that warrant return to the ED, such as changes or worsening in symptoms.   Vital signs are stable at discharge.   BP 140/96  Pulse 70  Temp(Src) 98.3 F (36.8 C) (Oral)  Resp 20  SpO2 99%  Patient/guardian has voiced understanding and agreed to follow-up with the PCP or specialist.    I personally performed the services described in this documentation, which was scribed in my presence. The recorded information has been reviewed and is accurate.    Dahlia Client  Catriona Dillenbeck, PA-C 04/11/13 2107

## 2013-04-11 NOTE — ED Notes (Signed)
Patient felt an itch yesterday thinking that it may have been some toilet paper. Then today she felt it again and and then felt something crawling.

## 2013-04-11 NOTE — ED Notes (Signed)
Per pharmacist mebendazole (VERMOX) is a discontinued medication,  Spoke with MD Hyacinth Meeker and he will take pharmacist's call.

## 2013-04-12 NOTE — ED Provider Notes (Signed)
Medical screening examination/treatment/procedure(s) were performed by non-physician practitioner and as supervising physician I was immediately available for consultation/collaboration.    Kiauna Zywicki D Ronaldo Crilly, MD 04/12/13 2000 

## 2013-04-19 ENCOUNTER — Ambulatory Visit (INDEPENDENT_AMBULATORY_CARE_PROVIDER_SITE_OTHER): Payer: BC Managed Care – PPO | Admitting: Emergency Medicine

## 2013-04-19 VITALS — BP 120/84 | HR 74 | Temp 98.9°F | Resp 16 | Ht 65.0 in | Wt 149.0 lb

## 2013-04-19 DIAGNOSIS — B779 Ascariasis, unspecified: Secondary | ICD-10-CM | POA: Insufficient documentation

## 2013-04-19 NOTE — Progress Notes (Signed)
[facility] [address] [place] [address] [phone]  Date:  04/19/2013   Name:  Dawn Buckley   DOB:  04-14-1976   MRN:  [id number]  PCP:  Lucilla Edin, MD    Chief Complaint: Follow-up   History of Present Illness:  Dawn Buckley is a 37 y.o. very pleasant female patient who presents with the following:  Seen in er and treated for ascariasis.  Treated with medication and has seen no further evidence of infestation.  Asymptomatic.  Patient Active Problem List   Diagnosis Date Noted  . MS (multiple sclerosis)     Past Medical History  Diagnosis Date  . Heart murmur   . MS (multiple sclerosis)     Past Surgical History  Procedure Laterality Date  . Breast surgery      History  Substance Use Topics  . Smoking status: Never Smoker   . Smokeless tobacco: Never Used  . Alcohol Use: No    Family History  Problem Relation Age of Onset  . Hypertension Mother   . Hypertension Father   . Kidney disease Sister     medication induced  . COPD Sister     emphysema  . Hypertension Sister   . Hypertension Brother     Allergies  Allergen Reactions  . Codeine Nausea And Vomiting  . Sulfa Antibiotics Nausea And Vomiting    Medication list has been reviewed and updated.  Current Outpatient Prescriptions on File Prior to Visit  Medication Sig Dispense Refill  . B Complex-C-Folic Acid (B COMPLEX-VITAMIN C-FOLIC ACID) 1 MG tablet Take 1 tablet by mouth daily with breakfast.      . cholecalciferol (VITAMIN D) 1000 UNITS tablet Take 1,000 Units by mouth daily.      Marland Kitchen desogestrel-ethinyl estradiol (KARIVA,AZURETTE,MIRCETTE) 0.15-0.02/0.01 MG (21/5) tablet Take 1 tablet by mouth daily.      Marland Kitchen glatiramer (COPAXONE) 20 MG/ML injection Inject 1 mL (20 mg total) into the skin daily.  1 kit  6  . Methylsulfonylmethane (MSM) 1000 MG TABS Take 3 tablets by mouth daily.      . Multiple Vitamin (MULITIVITAMIN WITH MINERALS) TABS Take 1 tablet by mouth  daily.      . Omega-3 Fatty Acids (FISH OIL) 300 MG CAPS Take 300 mg by mouth 3 (three) times daily.      . vitamin B-12 (CYANOCOBALAMIN) 1000 MCG tablet Take 1,000 mcg by mouth daily.      Marland Kitchen albuterol (PROVENTIL HFA;VENTOLIN HFA) 108 (90 BASE) MCG/ACT inhaler Inhale 2 puffs into the lungs every 4 (four) hours as needed for wheezing.  1 Inhaler  1  . mebendazole (VERMOX) 100 MG chewable tablet Chew 1 tablet (100 mg total) by mouth 2 (two) times daily.  6 tablet  0   No current facility-administered medications on file prior to visit.    Review of Systems:  As per HPI, otherwise negative.    Physical Examination: Filed Vitals:   04/19/13 0809  BP: 120/84  Pulse: 74  Temp: 98.9 F (37.2 C)  Resp: 16   Filed Vitals:   04/19/13 0809  Height: 5\' 5"  (1.651 m)  Weight: 149 lb (67.586 kg)   Body mass index is 24.79 kg/(m^2). Ideal Body Weight: Weight in (lb) to have BMI = 25: 149.9   GEN: WDWN, NAD, Non-toxic, Alert & Oriented x 3 HEENT: Atraumatic, Normocephalic.  Ears and Nose: No external deformity. EXTR: No clubbing/cyanosis/edema NEURO: Normal gait.  PSYCH: Normally interactive. Conversant.  Not depressed or anxious appearing.  Calm demeanor.    Assessment and Plan: Ascariasis  Signed,  Phillips Odor, MD

## 2013-04-19 NOTE — Patient Instructions (Signed)
Intestinal Parasites (Worms)  Your exam shows the presence of intestinal worms. Roundworms and tapeworms are common and cause a variety of symptoms: stomach cramps, diarrhea, nausea, passage of worms in the stool, and allergic reactions. Roundworms can cause hives, asthma, pneumonia, and blockage of the bowels.   Tapeworms usually come from undercooked meat (beef, pork, and fish). Roundworms are picked up through the soil. It is very important that you take the medicine you have been prescribed to destroy these parasites. It is also very important that you see your doctor in follow-up to evaluate the results of your treatment. Sometimes a second course of medicine is needed to get rid of the parasite worms.  Document Released: 06/18/2005 Document Revised: 09/10/2011 Document Reviewed: 12/09/2006  [employer] Patient Information 2014 [employer].

## 2013-04-22 ENCOUNTER — Telehealth: Payer: Self-pay

## 2013-04-22 NOTE — Telephone Encounter (Signed)
Please let the patient know labs were negative. Follow up as needed       Called to advise.

## 2013-04-22 NOTE — Telephone Encounter (Signed)
Patient is calling to see if lab results have come back yet.  [phone]

## 2013-05-03 ENCOUNTER — Ambulatory Visit (INDEPENDENT_AMBULATORY_CARE_PROVIDER_SITE_OTHER): Payer: BC Managed Care – PPO

## 2013-05-03 DIAGNOSIS — Z23 Encounter for immunization: Secondary | ICD-10-CM

## 2013-05-12 ENCOUNTER — Telehealth: Payer: Self-pay | Admitting: Diagnostic Neuroimaging

## 2013-05-12 ENCOUNTER — Ambulatory Visit (INDEPENDENT_AMBULATORY_CARE_PROVIDER_SITE_OTHER): Payer: BC Managed Care – PPO | Admitting: Diagnostic Neuroimaging

## 2013-05-12 ENCOUNTER — Encounter: Payer: Self-pay | Admitting: Diagnostic Neuroimaging

## 2013-05-12 ENCOUNTER — Other Ambulatory Visit: Payer: Self-pay | Admitting: Diagnostic Neuroimaging

## 2013-05-12 VITALS — BP 140/87 | HR 74 | Ht 64.0 in | Wt 151.0 lb

## 2013-05-12 DIAGNOSIS — R200 Anesthesia of skin: Secondary | ICD-10-CM

## 2013-05-12 DIAGNOSIS — R209 Unspecified disturbances of skin sensation: Secondary | ICD-10-CM

## 2013-05-12 DIAGNOSIS — G35 Multiple sclerosis: Secondary | ICD-10-CM

## 2013-05-12 NOTE — Telephone Encounter (Signed)
Called, no answer, lt VM for return call back

## 2013-05-12 NOTE — Telephone Encounter (Signed)
Called patient for sooner appt, no answer, lt VM message for call back

## 2013-05-12 NOTE — Telephone Encounter (Signed)
sched patient w/ Penumalli today
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
## 2013-05-12 NOTE — Patient Instructions (Signed)
I will check labs.  Monitor symptoms for increase in frequency, severity or development of weakness/balance issues.

## 2013-05-13 ENCOUNTER — Other Ambulatory Visit: Payer: BC Managed Care – PPO

## 2013-05-14 ENCOUNTER — Telehealth: Payer: Self-pay | Admitting: Diagnostic Neuroimaging

## 2013-05-14 NOTE — Telephone Encounter (Signed)
Called patient. Gave lab results

## 2013-05-14 NOTE — Telephone Encounter (Signed)
Patient requesting lab results. Not showing in system. Called [facility]. Will have labs forwarded to our office by fax. Made patient aware, advised will call later with results. Patient agreed.

## 2013-05-29 LAB — HEMOGLOBIN A1C: Est. average glucose Bld gHb Est-mCnc: 114 mg/dL

## 2013-05-29 LAB — VITAMIN B12: Vitamin B-12: 1395 pg/mL — ABNORMAL HIGH (ref 211–946)

## 2013-06-11 ENCOUNTER — Other Ambulatory Visit: Payer: BC Managed Care – PPO

## 2013-07-08 ENCOUNTER — Telehealth: Payer: Self-pay | Admitting: Diagnostic Neuroimaging

## 2013-07-08 MED ORDER — GLATIRAMER ACETATE 20 MG/ML ~~LOC~~ KIT: 20.0000 mg | PACK | Freq: Every day | SUBCUTANEOUS | Status: DC

## 2013-07-08 NOTE — Telephone Encounter (Signed)
Patient is out of medications. Pharmacy said they faxed request to us but have not heard back. [facility] phone [phone].  Please call patient with any questions [phone].

## 2013-07-08 NOTE — Telephone Encounter (Signed)
I called the patient to verify what med she needed filled.  She needs Copaxone.  I called the pahrmacy.  Spoke with Jesusita Okaan.  Gave verbal order.  They will contact patient for shipment.

## 2013-07-31 ENCOUNTER — Ambulatory Visit: Payer: BC Managed Care – PPO | Admitting: Nurse Practitioner

## 2013-08-20 ENCOUNTER — Ambulatory Visit: Payer: BC Managed Care – PPO | Admitting: Diagnostic Neuroimaging

## 2013-09-28 ENCOUNTER — Ambulatory Visit: Payer: BC Managed Care – PPO | Admitting: Diagnostic Neuroimaging

## 2013-10-05 ENCOUNTER — Telehealth: Payer: Self-pay | Admitting: Diagnostic Neuroimaging

## 2013-10-05 NOTE — Telephone Encounter (Signed)
I called pt and relayed the instructions below.  She stated that she took about 2100.  SE lasted about 2 hours.  Did not take it last night.   Will continue tonight and try the benadryl and tylenol and proceed as per below. To callback if needed.

## 2013-10-05 NOTE — Telephone Encounter (Signed)
I called pt and LMVM for her that returning call.   Dr. Marjory Lies out of office.   Will forward to [facility].  Related that have had pt have similar SE, and was a one time effect.

## 2013-10-05 NOTE — Telephone Encounter (Signed)
Please let her know it is ok to continue the medication if it was a one time event. I would suggest taking an over the counter benadryl and low dose tylenol prior to her next dose. If everything is normal after the next dose then she can stop the benadryl and tylenol. If she continues to have the symptoms then she should call our office again. Thanks

## 2013-10-05 NOTE — Telephone Encounter (Signed)
Spoke to patient.  She says the [facility] nurse advised her to skip Sunday's dose, but to contact our office to report side effects, and to see if she should continue Copaxone injection today. Patient says she has never had this type of reaction to med before out of the 2 years she has been on it. She feels better today, just a little weak from symptoms she experienced Saturday. Please advise.

## 2013-10-05 NOTE — Telephone Encounter (Signed)
Patient did Copazone injection on Saturday-had side effects of light headedness, red face, fever, chills, diarrhea and shortness of breath--patient did call [facility] and talked to a nurse and was advised to call and report to our office--please call patient.

## 2013-10-30 ENCOUNTER — Telehealth: Payer: Self-pay | Admitting: *Deleted

## 2013-10-30 NOTE — Telephone Encounter (Signed)
Pt called this am.  Having L upper back stiffness, started yesterday afternoon.  Took motrin which helped.  Now has this am.  Questioning MS exacerbation.  Next RV in 12/2013.  Has not done anything that would have caused this (like exercise, etc).  Pt on copaxone.

## 2013-10-30 NOTE — Telephone Encounter (Signed)
Doesn't sound like MS exacerbation, but offer work in appt for next week. -VRP

## 2013-11-02 ENCOUNTER — Ambulatory Visit: Payer: Self-pay | Admitting: Diagnostic Neuroimaging

## 2013-11-11 ENCOUNTER — Other Ambulatory Visit: Payer: Self-pay

## 2013-11-11 DIAGNOSIS — Z1231 Encounter for screening mammogram for malignant neoplasm of breast: Secondary | ICD-10-CM

## 2013-11-24 ENCOUNTER — Ambulatory Visit: Payer: BC Managed Care – PPO

## 2013-12-11 ENCOUNTER — Telehealth: Payer: Self-pay | Admitting: Neurology

## 2013-12-11 NOTE — Telephone Encounter (Signed)
Spoke to patient and she relayed she is having urinary frequency, and pressure.  Not sure if it's related to her MS.  It has been going on for about a week and gets her up all night.   Spoke to doctor and he recommended she see her PCP to see if it's a UTI, or come in to see NP, a referral can be made to Urology, and consider a MRI.    Patient decided to see her PCP first and then possible referral to Urology.  She will let us know what PCP finds.  She has a follow up appt on 01-12-14.

## 2014-01-12 ENCOUNTER — Ambulatory Visit: Payer: BC Managed Care – PPO | Admitting: Nurse Practitioner

## 2014-01-12 ENCOUNTER — Encounter: Payer: Self-pay | Admitting: Diagnostic Neuroimaging

## 2014-01-12 ENCOUNTER — Ambulatory Visit (INDEPENDENT_AMBULATORY_CARE_PROVIDER_SITE_OTHER): Payer: BC Managed Care – PPO | Admitting: Diagnostic Neuroimaging

## 2014-01-12 VITALS — BP 118/76 | HR 71 | Temp 99.0°F | Ht 64.0 in | Wt 163.8 lb

## 2014-01-12 DIAGNOSIS — G35 Multiple sclerosis: Secondary | ICD-10-CM

## 2014-01-12 NOTE — Patient Instructions (Signed)
I will check MRI.   Continue copaxone.
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
## 2014-01-20 ENCOUNTER — Ambulatory Visit (INDEPENDENT_AMBULATORY_CARE_PROVIDER_SITE_OTHER): Payer: BC Managed Care – PPO

## 2014-01-20 DIAGNOSIS — G35 Multiple sclerosis: Secondary | ICD-10-CM

## 2014-01-20 MED ORDER — GADOPENTETATE DIMEGLUMINE 469.01 MG/ML IV SOLN: 15.0000 mL | Freq: Once | INTRAVENOUS | Status: AC | PRN

## 2014-01-22 ENCOUNTER — Telehealth: Payer: Self-pay | Admitting: Diagnostic Neuroimaging

## 2014-01-22 NOTE — Telephone Encounter (Signed)
Pt calling requesting MRI results. Dr. Marjory LiesPenumalli out of the office[facility] Dr. Hosie PoissonSumner. Please advise

## 2014-01-22 NOTE — Telephone Encounter (Signed)
Called Dawn Buckley to inform her per Dr. Hosie Poisson that her MRI results appears to be stable compared to the prior MRI one year ago. I advised the Dawn Buckley that if she has any other problems, questions or concerns to call the office. Dawn Buckley verbalized understanding.

## 2014-01-22 NOTE — Telephone Encounter (Signed)
Patient requesting MRI results.  Please call and advise. °

## 2014-01-22 NOTE — Telephone Encounter (Signed)
Please let her know that it appears to be stable compared to the prior MRI one year ago.

## 2014-03-10 ENCOUNTER — Other Ambulatory Visit: Payer: Self-pay

## 2014-03-10 DIAGNOSIS — Z9889 Other specified postprocedural states: Secondary | ICD-10-CM

## 2014-03-10 DIAGNOSIS — Z1231 Encounter for screening mammogram for malignant neoplasm of breast: Secondary | ICD-10-CM

## 2014-03-19 ENCOUNTER — Ambulatory Visit: Payer: BC Managed Care – PPO

## 2014-03-29 ENCOUNTER — Ambulatory Visit
Admission: RE | Admit: 2014-03-29 | Discharge: 2014-03-29 | Disposition: A | Discharge status: Home / Self Care | Payer: BC Managed Care – PPO | Source: Ambulatory Visit

## 2014-03-29 DIAGNOSIS — Z1231 Encounter for screening mammogram for malignant neoplasm of breast: Secondary | ICD-10-CM

## 2014-03-29 DIAGNOSIS — Z9889 Other specified postprocedural states: Secondary | ICD-10-CM

## 2014-04-17 IMAGING — CR DG CERVICAL SPINE 2 OR 3 VIEWS
2 series · 2 of 2 positions shown · non-contrast
Comparison: None.

CLINICAL DATA: Left arm numbness.

CERVICAL SPINE - 2-3 VIEW

[lateral]
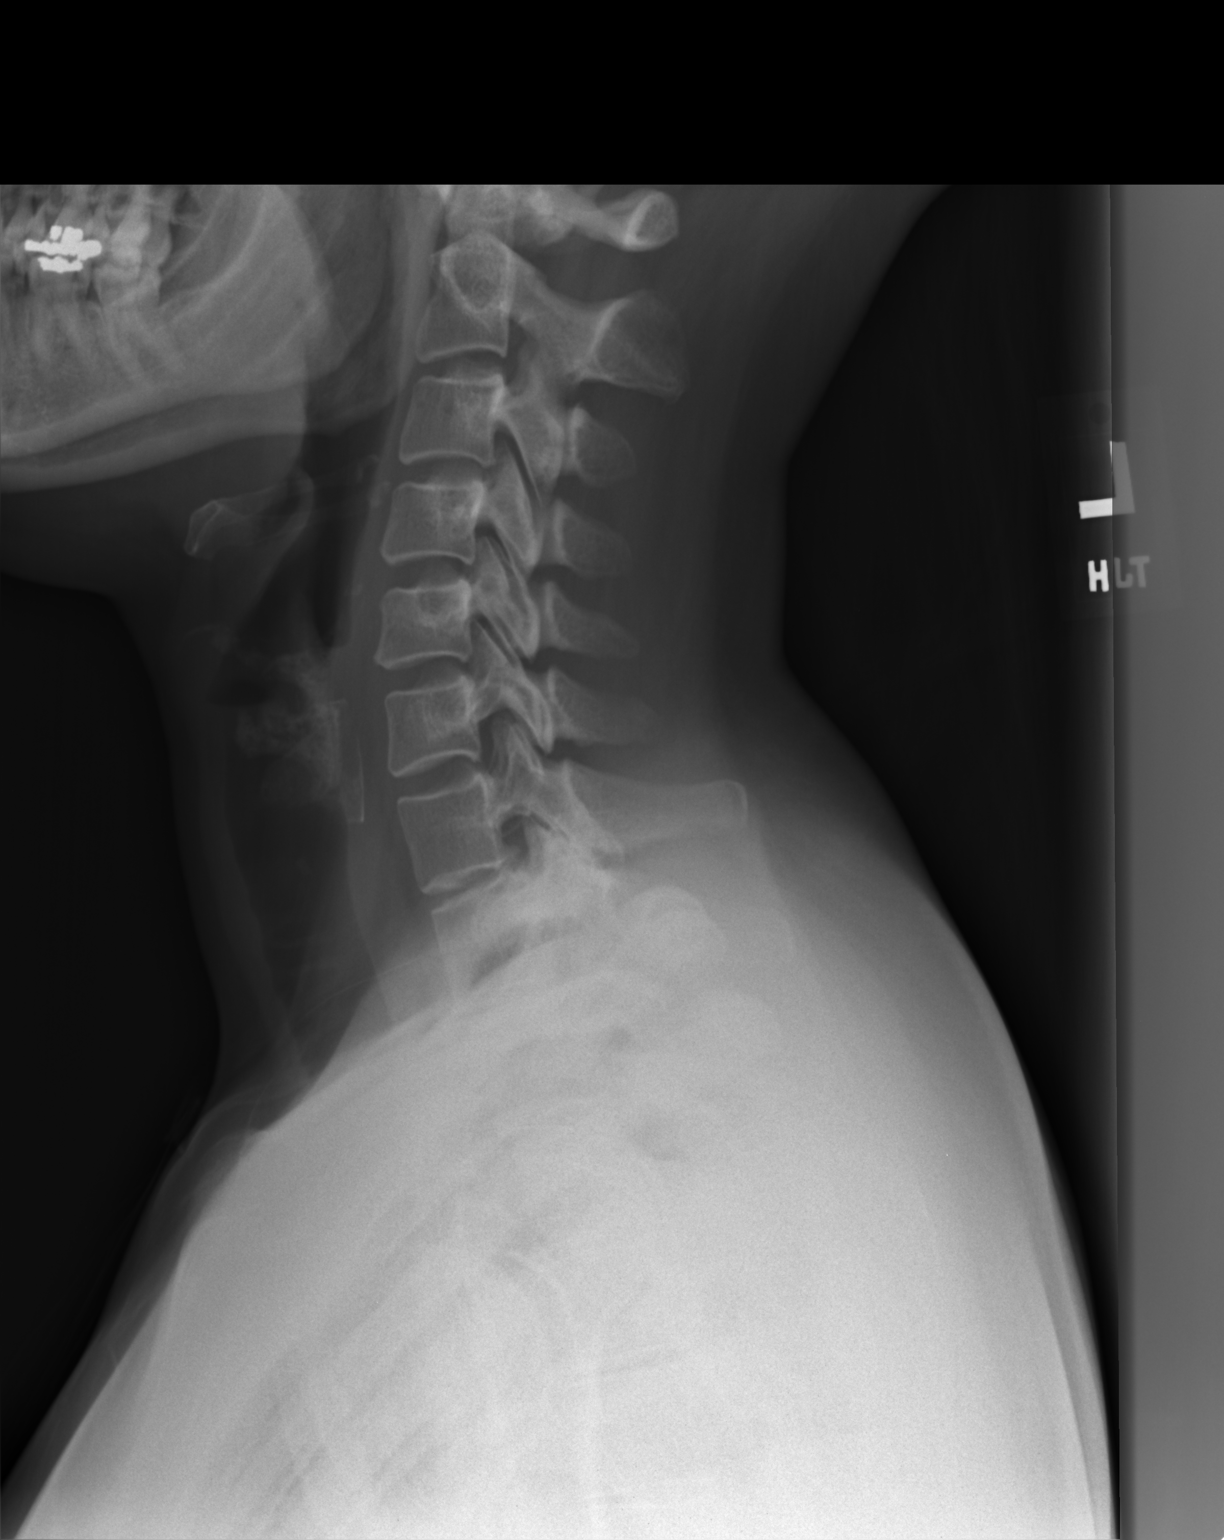

[AP]
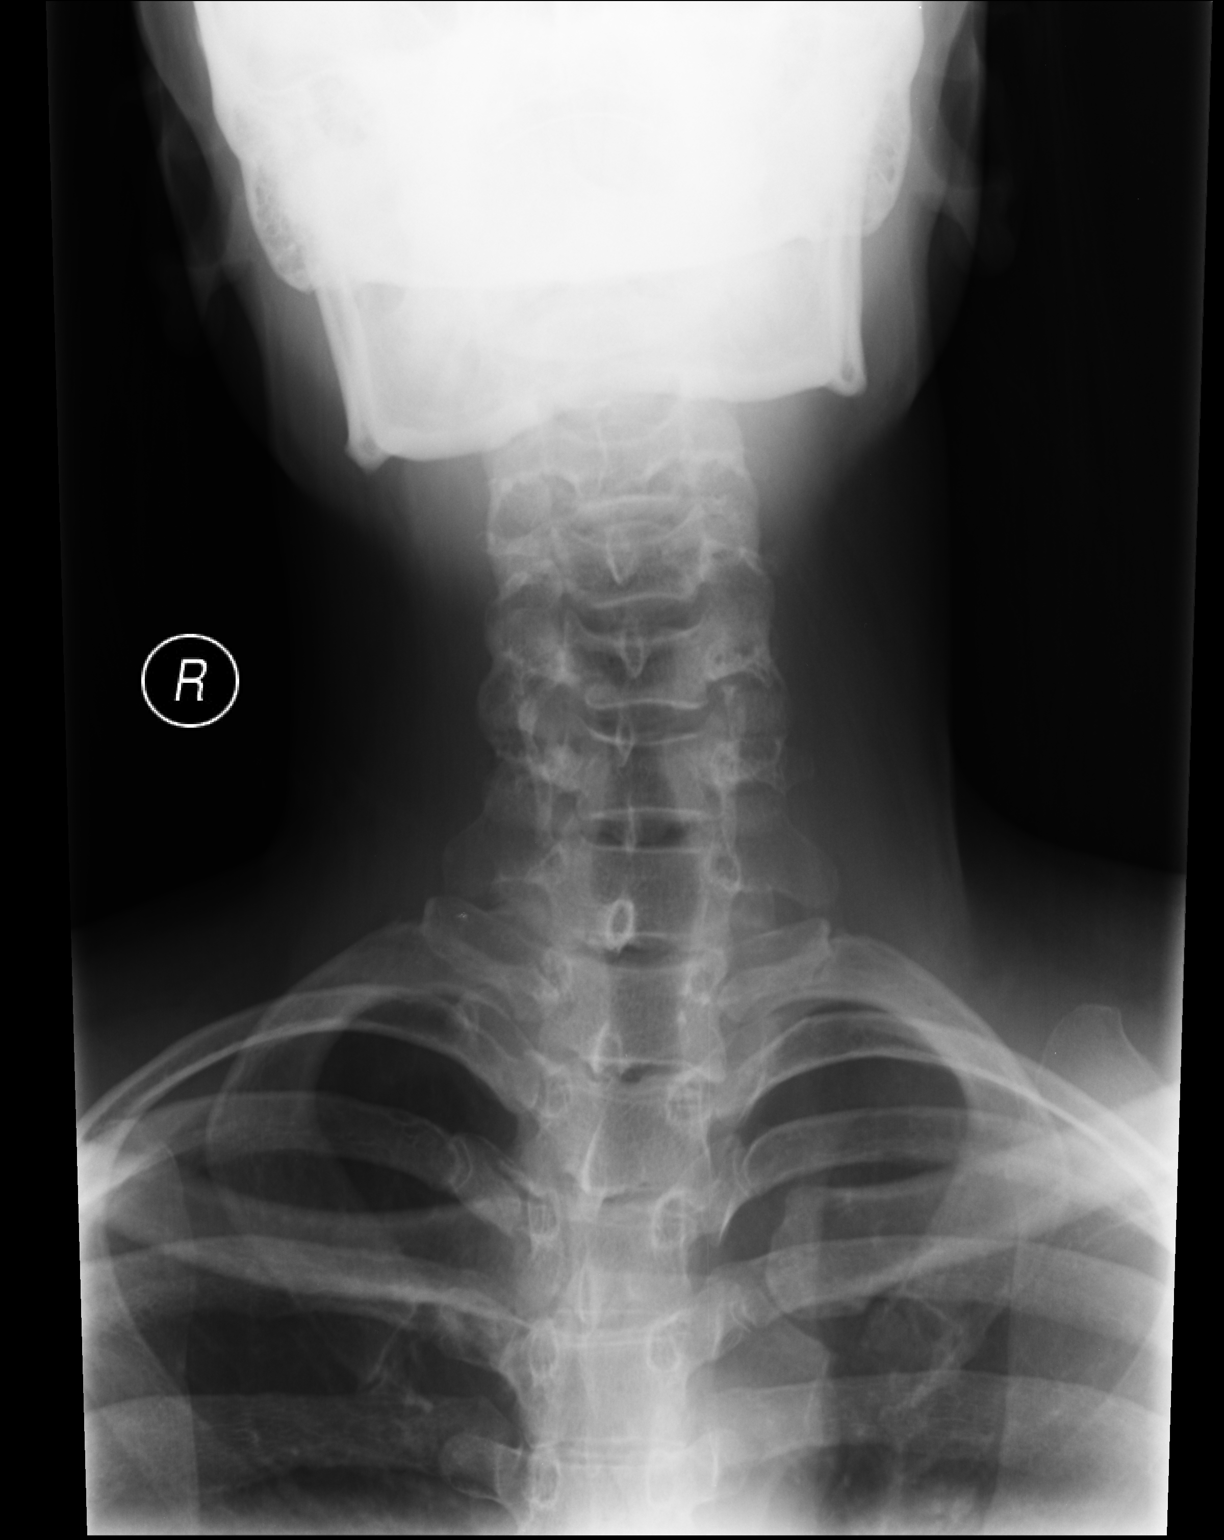

[2 of 2 positions shown; findings below may reference images not displayed]

FINDINGS: No prevertebral soft tissue swelling is evident.
Lordosis is preserved.  Alignment is normal.  Intervertebral disc
spaces are maintained.  There is slight curvature of the spine on
the AP image convexity to the left which could reflect slight
torticollis and spasm.  No fracture, subluxation, or spondylosis is
evident.
IMPRESSION: Question slight torticollis.  Normal otherwise.

Clinically significant discrepancy from primary report, if
provided: None

## 2014-05-03 ENCOUNTER — Encounter: Payer: Self-pay | Admitting: Diagnostic Neuroimaging

## 2014-05-05 ENCOUNTER — Encounter: Payer: Self-pay | Admitting: Diagnostic Neuroimaging

## 2014-05-05 ENCOUNTER — Ambulatory Visit (INDEPENDENT_AMBULATORY_CARE_PROVIDER_SITE_OTHER): Payer: BC Managed Care – PPO | Admitting: Diagnostic Neuroimaging

## 2014-05-05 VITALS — BP 130/85 | HR 72 | Ht 65.5 in | Wt 167.8 lb

## 2014-05-05 DIAGNOSIS — R109 Unspecified abdominal pain: Secondary | ICD-10-CM

## 2014-05-05 DIAGNOSIS — T148XXA Other injury of unspecified body region, initial encounter: Secondary | ICD-10-CM

## 2014-05-05 DIAGNOSIS — G35 Multiple sclerosis: Secondary | ICD-10-CM
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
## 2014-05-05 NOTE — Patient Instructions (Addendum)
Try ibuprofen, stretching, ben-gay.   If symptoms worsen, let us know, then we may check MRI or try steroids.

## 2014-07-05 ENCOUNTER — Telehealth: Payer: Self-pay | Admitting: Diagnostic Neuroimaging

## 2014-07-05 NOTE — Telephone Encounter (Signed)
I contacted ins and was able to get an approval.  They will send both [place] and the patient approval info via letter.  I called the patient back.  She is aware.  She will contact pharmacy, and says they should have meds delivered in 24-48 hours.

## 2014-07-05 NOTE — Telephone Encounter (Signed)
Patient stated [employer] needs prior authorization for Rx glatiramer (COPAXONE) 20 MG/ML injection.  Patient questioning if we had samples of medication until she gets Rx filled.  Prior authorization # [id number].  Please call and advise.

## 2014-07-15 ENCOUNTER — Ambulatory Visit: Payer: BC Managed Care – PPO | Admitting: Diagnostic Neuroimaging

## 2014-07-27 ENCOUNTER — Other Ambulatory Visit: Payer: Self-pay

## 2014-07-27 MED ORDER — GLATIRAMER ACETATE 20 MG/ML ~~LOC~~ SOSY: 20.0000 mg | PREFILLED_SYRINGE | Freq: Every day | SUBCUTANEOUS | Status: DC

## 2014-08-19 ENCOUNTER — Encounter: Payer: Self-pay | Admitting: Diagnostic Neuroimaging

## 2014-08-19 ENCOUNTER — Ambulatory Visit (INDEPENDENT_AMBULATORY_CARE_PROVIDER_SITE_OTHER): Payer: BLUE CROSS/BLUE SHIELD | Admitting: Diagnostic Neuroimaging

## 2014-08-19 VITALS — BP 119/72 | HR 72 | Ht 65.0 in | Wt 159.2 lb

## 2014-08-19 DIAGNOSIS — G35 Multiple sclerosis: Secondary | ICD-10-CM
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
## 2014-12-23 ENCOUNTER — Telehealth: Payer: Self-pay | Admitting: Diagnostic Neuroimaging

## 2014-12-23 NOTE — Telephone Encounter (Signed)
Patient is calling because her sister needs a kidney transplant and the patient is wondering if she could be a donor since she has MS. Please call and advise.

## 2014-12-23 NOTE — Telephone Encounter (Signed)
Called patient and left vm informing her that per Dr Epimenio Foot, if her kidney function is good she would be able to be considered as a kidney donor for her sister. Informed her that MS medications do not affect kidneys, per Dr Epimenio Foot. Left this caller's name and number for any further questions.

## 2015-02-17 ENCOUNTER — Encounter: Payer: Self-pay | Admitting: Diagnostic Neuroimaging

## 2015-02-17 ENCOUNTER — Ambulatory Visit (INDEPENDENT_AMBULATORY_CARE_PROVIDER_SITE_OTHER): Payer: BLUE CROSS/BLUE SHIELD | Admitting: Diagnostic Neuroimaging

## 2015-02-17 VITALS — BP 119/75 | HR 71 | Ht 65.0 in | Wt 141.2 lb

## 2015-02-17 DIAGNOSIS — G35 Multiple sclerosis: Secondary | ICD-10-CM | POA: Diagnosis not present

## 2015-02-17 NOTE — Patient Instructions (Signed)
Check MRI brain in July 2017.  Follow up in clinic in August 2017.
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
## 2015-04-07 ENCOUNTER — Telehealth: Payer: Self-pay | Admitting: Diagnostic Neuroimaging

## 2015-04-07 NOTE — Telephone Encounter (Signed)
i called patient x 2; no answer. -VRP 

## 2015-04-07 NOTE — Telephone Encounter (Signed)
Patient called to advise she is having stiffness and pain on Left side. Has had this in the past. Is wondering if we could call in something for her.

## 2015-04-07 NOTE — Telephone Encounter (Signed)
Pt reports that she woke up this morning and the L side of her back and shoulder are stiff and painful. Pt reports that she might have moved a few boxes at work yesterday and is concerned that this might be from the lifting, or it might be a MS relapse.  Pt has taken ibuprofen for the pain, but says that last time this happened, she was given 3 days worth of steroids, (she thinks it was prednisone), and this helped the stiffness and pain greatly.  She is wondering if Dr. Kyla Balzarine is willing to call in steroids for this, and does he think this might be a MS relapse?  I advised pt that either I or Dr. Marjory Lies would call her back as soon as we had an answer for her. Pt verbalized understanding.

## 2015-04-08 NOTE — Telephone Encounter (Signed)
I called patient; patient has been taking ibuprofen and feeling better now. Will hold off on prednisone. -VRP

## 2015-08-10 ENCOUNTER — Telehealth: Payer: Self-pay | Admitting: Diagnostic Neuroimaging

## 2015-08-10 MED ORDER — GLATIRAMER ACETATE 20 MG/ML ~~LOC~~ SOSY: 20.0000 mg | PREFILLED_SYRINGE | Freq: Every day | SUBCUTANEOUS | Status: DC

## 2015-08-10 NOTE — Telephone Encounter (Addendum)
Pt needs refill on glatiramer (COPAXONE) 20 MG/ML SOSY injection. Pt is currently out of medication.

## 2016-01-23 ENCOUNTER — Telehealth: Payer: Self-pay | Admitting: Diagnostic Neuroimaging

## 2016-01-23 NOTE — Telephone Encounter (Signed)
Pt called in requesting letter stating when she was fist diagnosed with MS. Please call [phone]

## 2016-01-30 NOTE — Telephone Encounter (Signed)
Called listed home phone by mistake, did not LVM.  Called [phone] as requested in phone note; no answer and voice mailbox not set up.

## 2016-02-06 NOTE — Telephone Encounter (Signed)
Received call back from patient re: letter. She stated her husband's work was asking for letter, but she was able to provide them with alternate information. She stated she does not need a letter at this time. She thanked this caller.

## 2016-02-06 NOTE — Telephone Encounter (Addendum)
Attempted to reach patient, LVM requesting call back. Left name, number.

## 2016-02-16 ENCOUNTER — Encounter: Payer: Self-pay | Admitting: Diagnostic Neuroimaging

## 2016-02-16 ENCOUNTER — Ambulatory Visit (INDEPENDENT_AMBULATORY_CARE_PROVIDER_SITE_OTHER): Payer: BLUE CROSS/BLUE SHIELD | Admitting: Diagnostic Neuroimaging

## 2016-02-16 VITALS — BP 126/81 | HR 73 | Wt 154.0 lb

## 2016-02-16 DIAGNOSIS — R5383 Other fatigue: Secondary | ICD-10-CM | POA: Diagnosis not present

## 2016-02-16 DIAGNOSIS — G35 Multiple sclerosis: Secondary | ICD-10-CM | POA: Diagnosis not present
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
## 2016-02-23 ENCOUNTER — Telehealth: Payer: Self-pay | Admitting: Diagnostic Neuroimaging

## 2016-02-23 NOTE — Telephone Encounter (Signed)
Patient called to check status of scheduling MRI, patient doesn't have a preference of where MRI is done.

## 2016-02-23 NOTE — Telephone Encounter (Signed)
Pt called back to see about scheduling. Please call.

## 2016-02-28 ENCOUNTER — Telehealth: Payer: Self-pay | Admitting: Diagnostic Neuroimaging

## 2016-02-28 NOTE — Telephone Encounter (Signed)
Pt called requesting to schedule MRI appt.

## 2016-03-14 ENCOUNTER — Other Ambulatory Visit: Payer: BLUE CROSS/BLUE SHIELD

## 2016-03-14 NOTE — Telephone Encounter (Signed)
Spoke with patient.

## 2016-03-28 ENCOUNTER — Ambulatory Visit (INDEPENDENT_AMBULATORY_CARE_PROVIDER_SITE_OTHER): Payer: BLUE CROSS/BLUE SHIELD

## 2016-03-28 DIAGNOSIS — G35 Multiple sclerosis: Secondary | ICD-10-CM

## 2016-03-29 MED ORDER — GADOPENTETATE DIMEGLUMINE 469.01 MG/ML IV SOLN: 13.0000 mL | Freq: Once | INTRAVENOUS | Status: AC | PRN

## 2016-03-30 ENCOUNTER — Telehealth: Payer: Self-pay | Admitting: Diagnostic Neuroimaging

## 2016-03-30 NOTE — Telephone Encounter (Signed)
Per Dr Marjory Lies, spoke with patient and informed her that her MRI results did not show any acute  [place], results are unremarkable. Did advise her Dr Marjory Lies is waiting for previous MRI to make comparison. Advised she will get a call after he completes that process. She verbalized understanding, appreciation.

## 2016-03-30 NOTE — Telephone Encounter (Signed)
Pt called request MRI results °

## 2016-04-04 NOTE — Telephone Encounter (Signed)
LVM informing patient Dr Marjory LiesPenumalli received previous MRI disk this morning. He will compare MRIs this evening or tomorrow morning. Advised this RN will call her tomorrow. Thanked her for her patience and left number.

## 2016-04-04 NOTE — Telephone Encounter (Signed)
Pt called wanting to know if Dr Demetrius Charity has compared previous MRI. Please call

## 2016-04-05 NOTE — Telephone Encounter (Signed)
Per Dr Marjory LiesPenumalli spoke with patient and informed her that there is no change from her previous MRI to most recent. She verbalized understanding, appreciation.

## 2016-06-27 ENCOUNTER — Telehealth: Payer: Self-pay | Admitting: *Deleted

## 2016-06-27 MED ORDER — GLATIRAMER ACETATE 20 MG/ML ~~LOC~~ SOSY: 20.0000 mg | PREFILLED_SYRINGE | Freq: Every day | SUBCUTANEOUS | 5 refills | Status: DC

## 2016-06-27 NOTE — Telephone Encounter (Signed)
LVM advising patient that this RN received request to refill Copaxone. However pt needs a one yr FU scheduled. Requested she call back to schedule FU in Aug 2018 with phone staff. Advised Copaxone will be refilled. Left name, number.

## 2016-07-30 DIAGNOSIS — J029 Acute pharyngitis, unspecified: Secondary | ICD-10-CM | POA: Diagnosis not present

## 2016-07-30 DIAGNOSIS — R05 Cough: Secondary | ICD-10-CM | POA: Diagnosis not present

## 2016-08-27 ENCOUNTER — Telehealth: Payer: Self-pay | Admitting: Diagnostic Neuroimaging

## 2016-08-27 MED ORDER — GLATIRAMER ACETATE 20 MG/ML ~~LOC~~ SOSY: 20.0000 mg | PREFILLED_SYRINGE | Freq: Every day | SUBCUTANEOUS | 11 refills | Status: DC

## 2016-08-27 NOTE — Telephone Encounter (Signed)
Patient called office in reference to a change in her pharmacy due to her insurance.  Patient will now be using [facility] per patient they advised patient to have our office contact them directly to let them know patient will be using their pharmacy.  [facility] #- [id number].

## 2016-08-27 NOTE — Addendum Note (Signed)
Addended by: [place] Pink on: 08/27/2016 04:01 PM   Modules accepted: Orders

## 2016-08-27 NOTE — Telephone Encounter (Addendum)
Called [facility] and spoke with Colin Mulders, specialty pharmacy. She stated that they do not have current information on patient re: address, phone number, insurance. She stated they have not had contact with patient since 2016. She stated patient needs to call and give them updates, then Rx can be sent for Copaxone.   Called patient and advised her to call [facility] with her new information. She stated she just found out this morning she had to use [facility] now. She stated she is out of medication. Advised her a new Rx will be sent to them.  She verbalized understanding.

## 2016-08-28 NOTE — Telephone Encounter (Signed)
Pt called said she spoke with pharmacy yesterday and was advised generic was sent in, she needs name brand, PA  [phone] is required. Pharmacy advised if RN could call today RX would be shipped out today, she is out of the medication.

## 2016-08-28 NOTE — Telephone Encounter (Addendum)
Called [phone] Rx and spoke with pharmacist, Burnett Harry who stated that the Rx received yesterday was taken as generic Rx. This RN informed her about today's phone note from patient.  Pharmacist inquired if this RN was changing Rx to dispense brand, advised her to dispense brand. Advised her that PA has been submitted on Cover My Meds but may take up to 3 days for decision. Burnett Harry stated they would dispense Rx today and [facility] would contact this office if further information was needed per insurance. This RN advised Burnett Harry that the insurance information available to this RN is dated 2015, therefore PA may not go through. Shelly verbalized understanding, appreciation.

## 2016-08-28 NOTE — Telephone Encounter (Signed)
Per Cover My Meds, PA not required at this time. It is on the list of covered drugs per [facility], [phone].

## 2016-08-29 ENCOUNTER — Telehealth: Payer: Self-pay | Admitting: *Deleted

## 2016-08-29 NOTE — Telephone Encounter (Signed)
Received call back from patient who is also on phone w/[facility] to try and resolve PA issue. On phone with patient and supervisor at [facility]. Per patient, supervisor stated no PA required, and she will expedite shipment of medication to patient.

## 2016-08-29 NOTE — Telephone Encounter (Signed)
Received call from patient stating she spoke with [facility] and was told she still need PA on Copaxoine. This RN explained that I went through the PA process yesterday, and got both computer and fax confirmation from [facility] stating no PA needed at this time. Medication is covered by patient's insurance. Advised patient the medication is coming from [facility], not [facility]. She stated she would call [facility] and her insurance company. She thanked this Charity fundraiser for information.

## 2016-08-30 ENCOUNTER — Telehealth: Payer: Self-pay | Admitting: *Deleted

## 2016-08-30 NOTE — Telephone Encounter (Signed)
Patient called to do conference call with [facility], stating she has gotten message from [facility] that Copaxone (brand only)  does needs PA.  Through conference call, patient reached Areshia with [facility] and this RN was able to receive PA # [id number], approved through 08/30/2021.  Patient was advised by Sherri Sear to call [facility] and give information to request Copaxone be mailed to her. Areshia ended the call, and patient ended the call.

## 2016-10-19 DIAGNOSIS — Z0000 Encounter for general adult medical examination without abnormal findings: Secondary | ICD-10-CM | POA: Diagnosis not present

## 2016-10-19 DIAGNOSIS — Z130 Encounter for screening for diseases of the blood and blood-forming organs and certain disorders involving the immune mechanism: Secondary | ICD-10-CM | POA: Diagnosis not present

## 2016-10-19 DIAGNOSIS — Z1321 Encounter for screening for nutritional disorder: Secondary | ICD-10-CM | POA: Diagnosis not present

## 2016-10-19 DIAGNOSIS — Z13220 Encounter for screening for lipoid disorders: Secondary | ICD-10-CM | POA: Diagnosis not present

## 2016-10-19 DIAGNOSIS — Z01419 Encounter for gynecological examination (general) (routine) without abnormal findings: Secondary | ICD-10-CM | POA: Diagnosis not present

## 2016-10-19 DIAGNOSIS — Z1329 Encounter for screening for other suspected endocrine disorder: Secondary | ICD-10-CM | POA: Diagnosis not present

## 2016-10-19 DIAGNOSIS — Z1231 Encounter for screening mammogram for malignant neoplasm of breast: Secondary | ICD-10-CM | POA: Diagnosis not present

## 2016-10-19 DIAGNOSIS — Z131 Encounter for screening for diabetes mellitus: Secondary | ICD-10-CM | POA: Diagnosis not present

## 2016-12-28 DIAGNOSIS — G35 Multiple sclerosis: Secondary | ICD-10-CM | POA: Diagnosis not present

## 2017-02-20 ENCOUNTER — Ambulatory Visit: Payer: BLUE CROSS/BLUE SHIELD | Admitting: Diagnostic Neuroimaging

## 2017-07-01 DIAGNOSIS — Z13220 Encounter for screening for lipoid disorders: Secondary | ICD-10-CM | POA: Diagnosis not present

## 2017-07-01 DIAGNOSIS — E559 Vitamin D deficiency, unspecified: Secondary | ICD-10-CM | POA: Diagnosis not present

## 2017-07-01 DIAGNOSIS — Z131 Encounter for screening for diabetes mellitus: Secondary | ICD-10-CM | POA: Diagnosis not present

## 2017-07-01 DIAGNOSIS — Z0000 Encounter for general adult medical examination without abnormal findings: Secondary | ICD-10-CM | POA: Diagnosis not present

## 2017-08-30 ENCOUNTER — Telehealth: Payer: Self-pay | Admitting: Diagnostic Neuroimaging

## 2017-08-30 ENCOUNTER — Encounter: Payer: Self-pay | Admitting: *Deleted

## 2017-08-30 MED ORDER — PREDNISONE 10 MG PO TABS
ORAL_TABLET | ORAL | 0 refills | Status: DC
Start: 2017-08-30 — End: 2017-09-05

## 2017-08-30 NOTE — Telephone Encounter (Signed)
Consulted Dr. Marjory Lies.  Order for prednisone dose pack to [facility] [place].  Appt made next wk, pt prefer to see MD.  Made Thursday 1130 09-05-17. She has no sx of infection.  She is asking for note for today, if possible to fax to [phone].

## 2017-08-30 NOTE — Telephone Encounter (Signed)
Pt called she is experiencing pain in the left arm and hand and stiffness in the left leg. This started about midnight last night. Pt was wanting to be seen today. Please call

## 2017-08-30 NOTE — Telephone Encounter (Signed)
I spoke to pt.  She stated that she woke up this am 0100 having severe L hand pain, stiffness.L arm sharp shooting pains, L hip stiffness.  She believes it is an exacerbation. No strain or truama to arm/leg.   She is taking copaxone.  Last MRI 03/2016. Last seen 02-16-16. She wanted to be seen today.

## 2017-08-30 NOTE — Telephone Encounter (Signed)
Ok to do note.  Fax confirmation received and pt informed.

## 2017-09-05 ENCOUNTER — Ambulatory Visit (INDEPENDENT_AMBULATORY_CARE_PROVIDER_SITE_OTHER): Payer: Commercial Managed Care - PPO | Admitting: Diagnostic Neuroimaging

## 2017-09-05 ENCOUNTER — Encounter: Payer: Self-pay | Admitting: Diagnostic Neuroimaging

## 2017-09-05 DIAGNOSIS — G35 Multiple sclerosis: Secondary | ICD-10-CM
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
## 2017-09-05 NOTE — Patient Instructions (Signed)
-   check MRI brain and cervical spine  - check labs  - continue copaxone  - consider tecfidera, ocrevus or tysabri

## 2017-09-09 ENCOUNTER — Other Ambulatory Visit: Payer: Self-pay | Admitting: *Deleted

## 2017-09-09 ENCOUNTER — Other Ambulatory Visit: Payer: Self-pay | Admitting: Diagnostic Neuroimaging

## 2017-09-09 MED ORDER — GLATIRAMER ACETATE 20 MG/ML ~~LOC~~ SOSY: 20.0000 mg | PREFILLED_SYRINGE | Freq: Every day | SUBCUTANEOUS | 11 refills | Status: DC

## 2017-09-10 LAB — CBC WITH DIFFERENTIAL/PLATELET
BASOS ABS: 0 10*3/uL (ref 0.0–0.2)
Basos: 0 %
EOS (ABSOLUTE): 0.1 10*3/uL (ref 0.0–0.4)
EOS: 1 %
HEMATOCRIT: 37.8 % (ref 34.0–46.6)
HEMOGLOBIN: 13 g/dL (ref 11.1–15.9)
IMMATURE GRANS (ABS): 0.1 10*3/uL (ref 0.0–0.1)
Immature Granulocytes: 1 %
LYMPHS ABS: 4.7 10*3/uL — AB (ref 0.7–3.1)
LYMPHS: 42 %
MCH: 29.1 pg (ref 26.6–33.0)
MCHC: 34.4 g/dL (ref 31.5–35.7)
MCV: 85 fL (ref 79–97)
MONOCYTES: 10 %
Monocytes Absolute: 1.2 10*3/uL — ABNORMAL HIGH (ref 0.1–0.9)
NEUTROS ABS: 5.1 10*3/uL (ref 1.4–7.0)
Neutrophils: 46 %
Platelets: 259 10*3/uL (ref 150–379)
RBC: 4.46 x10E6/uL (ref 3.77–5.28)
RDW: 13.4 % (ref 12.3–15.4)
WBC: 11.2 10*3/uL — AB (ref 3.4–10.8)

## 2017-09-10 LAB — COMPREHENSIVE METABOLIC PANEL
ALBUMIN: 4 g/dL (ref 3.5–5.5)
ALT: 17 IU/L (ref 0–32)
AST: 9 IU/L (ref 0–40)
Albumin/Globulin Ratio: 1.5 (ref 1.2–2.2)
Alkaline Phosphatase: 40 IU/L (ref 39–117)
BUN/Creatinine Ratio: 19 (ref 9–23)
BUN: 13 mg/dL (ref 6–24)
Bilirubin Total: 0.6 mg/dL (ref 0.0–1.2)
CO2: 28 mmol/L (ref 20–29)
CREATININE: 0.69 mg/dL (ref 0.57–1.00)
Calcium: 9 mg/dL (ref 8.7–10.2)
Chloride: 102 mmol/L (ref 96–106)
GFR, EST AFRICAN AMERICAN: 123 mL/min/{1.73_m2} (ref >59)
GFR, EST NON AFRICAN AMERICAN: 107 mL/min/{1.73_m2} (ref >59)
GLOBULIN, TOTAL: 2.6 g/dL (ref 1.5–4.5)
GLUCOSE: 85 mg/dL (ref 65–99)
Potassium: 3.5 mmol/L (ref 3.5–5.2)
SODIUM: 142 mmol/L (ref 134–144)
TOTAL PROTEIN: 6.6 g/dL (ref 6.0–8.5)

## 2017-09-10 LAB — QUANTIFERON-TB GOLD PLUS
QUANTIFERON-TB GOLD PLUS: NEGATIVE
QuantiFERON Nil Value: 0.08 IU/mL
QuantiFERON TB1 Ag Value: 0.14 IU/mL
QuantiFERON TB2 Ag Value: 0.11 IU/mL

## 2017-09-10 LAB — HEPATITIS B SURFACE ANTIBODY,QUALITATIVE: Hep B Surface Ab, Qual: REACTIVE

## 2017-09-10 LAB — HEPATITIS B CORE ANTIBODY, TOTAL: HEP B C TOTAL AB: NEGATIVE

## 2017-09-10 LAB — HEPATITIS B SURFACE ANTIGEN: HEP B S AG: NEGATIVE

## 2017-09-13 ENCOUNTER — Telehealth: Payer: Self-pay | Admitting: Diagnostic Neuroimaging

## 2017-09-13 ENCOUNTER — Other Ambulatory Visit: Payer: Self-pay

## 2017-09-13 LAB — STRATIFY JCV AB (W/ INDEX) W/ RFLX
INDEX VALUE: 3.64 — AB
Stratify JCV (TM) Ab w/Reflex Inhibition: POSITIVE — AB

## 2017-09-13 NOTE — Telephone Encounter (Signed)
Pt requesting a call to discuss her lab work

## 2017-09-17 NOTE — Telephone Encounter (Signed)
-----   Message from Suanne Marker, MD sent at 09/16/2017 10:38 AM EDT ----- Labs ok. Since JCV is positive, I would recommend tecfidera or ocrevus if we are going to switch meds. Recommend to follow up discussion after MRI scans. -VRP

## 2017-09-17 NOTE — Telephone Encounter (Signed)
Spoke to pt and relayed the lab results were ok per Dr. Marjory Lies.  JCV positive, recommended tecfidera or ocrevus if to switch medications.  Will f/u discussion after MRI scans.  Pt stated wanted to stay with copaxone at this time.  She verbalized understanding of results.

## 2017-11-12 DIAGNOSIS — Z1329 Encounter for screening for other suspected endocrine disorder: Secondary | ICD-10-CM | POA: Diagnosis not present

## 2017-11-12 DIAGNOSIS — Z130 Encounter for screening for diseases of the blood and blood-forming organs and certain disorders involving the immune mechanism: Secondary | ICD-10-CM | POA: Diagnosis not present

## 2017-11-12 DIAGNOSIS — Z833 Family history of diabetes mellitus: Secondary | ICD-10-CM | POA: Diagnosis not present

## 2017-11-12 DIAGNOSIS — Z0000 Encounter for general adult medical examination without abnormal findings: Secondary | ICD-10-CM | POA: Diagnosis not present

## 2017-11-12 DIAGNOSIS — Z01419 Encounter for gynecological examination (general) (routine) without abnormal findings: Secondary | ICD-10-CM | POA: Diagnosis not present

## 2017-11-12 DIAGNOSIS — Z13220 Encounter for screening for lipoid disorders: Secondary | ICD-10-CM | POA: Diagnosis not present

## 2017-11-12 DIAGNOSIS — Z1231 Encounter for screening mammogram for malignant neoplasm of breast: Secondary | ICD-10-CM | POA: Diagnosis not present

## 2018-03-14 ENCOUNTER — Telehealth: Payer: Self-pay | Admitting: Diagnostic Neuroimaging

## 2018-03-14 NOTE — Telephone Encounter (Signed)
Pt had to c/a her appt on 9/16-she is unable to get off work. Next available is 07/07/18. Can she be worked in sooner, he wanted her back in from last appt. Please call to advise

## 2018-03-14 NOTE — Telephone Encounter (Signed)
Spoke with patient and asked her about the MRIs Dr Marjory Lies had ordered in March as she did not have them done. She stated that she has been fine on her current medication, has no problems or new concerns. She stated if he insists she will have MRIs done. Other wise she just wants to be seen for 6 month FU. She stated any date in Oct will work with her job hours. Rescheduled her for [place] [address] and advised she arrive 20-30 minutes early. She verbalized understanding, appreciation.

## 2018-03-17 ENCOUNTER — Ambulatory Visit: Payer: Commercial Managed Care - PPO | Admitting: Diagnostic Neuroimaging

## 2018-04-09 ENCOUNTER — Ambulatory Visit (INDEPENDENT_AMBULATORY_CARE_PROVIDER_SITE_OTHER): Payer: Commercial Managed Care - PPO | Admitting: Diagnostic Neuroimaging

## 2018-04-09 ENCOUNTER — Encounter: Payer: Self-pay | Admitting: Diagnostic Neuroimaging

## 2018-04-09 VITALS — BP 154/91 | HR 73 | Ht 65.0 in | Wt 163.6 lb

## 2018-04-09 DIAGNOSIS — G35 Multiple sclerosis: Secondary | ICD-10-CM | POA: Diagnosis not present

## 2018-04-09 NOTE — Patient Instructions (Signed)
-   continue copaxone
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
## 2018-04-24 DIAGNOSIS — Z79899 Other long term (current) drug therapy: Secondary | ICD-10-CM | POA: Diagnosis not present

## 2018-04-24 DIAGNOSIS — E538 Deficiency of other specified B group vitamins: Secondary | ICD-10-CM | POA: Diagnosis not present

## 2018-04-24 DIAGNOSIS — R0602 Shortness of breath: Secondary | ICD-10-CM | POA: Diagnosis not present

## 2018-04-24 DIAGNOSIS — E7800 Pure hypercholesterolemia, unspecified: Secondary | ICD-10-CM | POA: Diagnosis not present

## 2018-04-24 DIAGNOSIS — E559 Vitamin D deficiency, unspecified: Secondary | ICD-10-CM | POA: Diagnosis not present

## 2018-04-24 DIAGNOSIS — R5383 Other fatigue: Secondary | ICD-10-CM | POA: Diagnosis not present

## 2018-06-09 DIAGNOSIS — Z0000 Encounter for general adult medical examination without abnormal findings: Secondary | ICD-10-CM | POA: Diagnosis not present

## 2018-06-09 DIAGNOSIS — G35 Multiple sclerosis: Secondary | ICD-10-CM | POA: Diagnosis not present

## 2018-06-09 DIAGNOSIS — E559 Vitamin D deficiency, unspecified: Secondary | ICD-10-CM | POA: Diagnosis not present

## 2018-07-03 ENCOUNTER — Telehealth: Payer: Self-pay | Admitting: Diagnostic Neuroimaging

## 2018-07-03 NOTE — Telephone Encounter (Signed)
Pt called stating she is currently on her last tablet of medication glatiramer (COPAXONE) 20 MG/ML SOSY injection but stating the pharmacy is needing a PA before refilling medication. Please advise

## 2018-07-03 NOTE — Telephone Encounter (Signed)
Agree. -VRP 

## 2018-07-03 NOTE — Telephone Encounter (Signed)
[facility], spoke with Genelle Bal, pharmacist to advise him that generic glatiramer acetate does not require PA per [facility]. Advised him the prescription doesn't specify brand only. He stated he had a note that the patient had requested brand the previous time.  He changed existing Rx to generic and stated he would call the patient to inform her. He will reach back out to this RN if the patient does not want generic. This RN advised him there is no documentation that the patient must take brand only, thanked him for time.

## 2018-07-03 NOTE — Telephone Encounter (Addendum)
Called [facility], spoke with representative who stated the patient has new insurance with BC[employer]. Called patient who stated she does not have BCBS.  She advised [facility] of that. She still has UMR. She stated she was given this # to call [phone]. This RN advised will call the # on back of her UMR card on file.  [facility]ed [facility], spoke with Lequita Halt and provided information required. She stated the patient does not have active insurance on file. Her insurance expired on 07/01/18.  Called patient and advised her of above, asked if she has new card; she does. This RN got card information which showed [employer] is now [facility]. This RN advised will have [facility].  [facility], spoke with Okey Regal, [place] dept who stated copaxone and it's generic, glatiramer requires a PA. She provided CMM Key: [id number] to begin PA process.  PA initiated on CMM. Received this message: No Prior Authorization is required at this time based on the alternative drug you chose to prescribe, glatiramer acetate.

## 2018-07-03 NOTE — Telephone Encounter (Signed)
I spoke with Dawn Buckley, with [facility] [phone] For glatiramir acetate pt ok with this and would like Whisperject device to give copaxone.  VO given to [place] ok.

## 2018-07-04 NOTE — Telephone Encounter (Signed)
Patient's new insurance : UMR with [employer].  Member# [id number] Rx BIN [id number] [id number]

## 2018-07-09 MED ORDER — GLATIRAMER ACETATE 20 MG/ML ~~LOC~~ SOSY: 20.0000 mg | PREFILLED_SYRINGE | Freq: Every day | SUBCUTANEOUS | 3 refills | Status: DC

## 2018-07-09 NOTE — Telephone Encounter (Signed)
Pt states she has reached out to [facility] and they have not received anything on the p.a.for the medication glatiramer (COPAXONE) 20 MG/ML SOSY injection that she has been without for a week.  Please call

## 2018-07-09 NOTE — Addendum Note (Signed)
Addended by: [place] Pink on: 07/09/2018 12:25 PM   Modules accepted: Orders

## 2018-07-09 NOTE — Telephone Encounter (Signed)
[facility] from [facility]: glatiramer acetate 20 mg/ml syringe approved, effective 07/02/18 through 07/31/21 and patient is to contact the pharmacy to request they finish processing the prescription. The patient was contacted by text earlier today per her report.

## 2018-07-09 NOTE — Telephone Encounter (Addendum)
[facility], on hold > 10 minutes with no answer. Called [facility], spoke with Rommel and advised him the patient is out of medication. Advised him the prescription was changed last week to glatiramer acetate which does not require a PA. He stated that he was unable to say whether the patient's insurance requires a PA because her insurance isn't contracted with [facility]. He advised this RN call her insurance.  [facility], spoke with Lynden Ang and inquired why the generic is now requiring PA when last week this RN was told it doesn't. That was the specific reason copaxone was changed to generic glatiramer. She stated it still shows PA required. Began PA over phone, answered clinical questions. Medication approved PA #[id number], authorization good 07/02/18 through 07/31/21.  Her new Insurance Member # [id number] [facility] [phone] and chose option for [facility], spoke with Dewayne Hatch and advised her new prescription was sent in, PA has been completed. Dewayne Hatch stated that the new Rx should be resent to [facility]; this was done.  This RN advised her the patient is out of medication. She stated she would put a rush on it. She advised this RN call back later today to check on status of prescription. This RN thanked her for her help and called the patient. The patient received a text from [facility] as she was on the phone. The text said they had received her order and were processing it. This RN advised her will call [facility] later today to check status.  She expressed some concern over getting generic for Copaxone. This RN advised her that if she has any issues, we will go back to her insurance for approval for brand. She verbalized understanding, appreciation.

## 2018-09-22 ENCOUNTER — Telehealth: Payer: Self-pay | Admitting: Diagnostic Neuroimaging

## 2018-09-22 ENCOUNTER — Encounter: Payer: Self-pay | Admitting: *Deleted

## 2018-09-22 NOTE — Telephone Encounter (Addendum)
Called patient to discuss. She works in a nursing home. She needs letter addressed to [facility] informing them she has MS, is immunocompromised and needs to work from home. She would like it faxed  [phone]. I advised her will let Dr Marjory Lies know of her request. She verbalized understanding, appreciation. Letter composed, signed by Dr Marjory Lies and faxed to [facility] as requested.

## 2018-09-22 NOTE — Telephone Encounter (Signed)
Pt's employer needs letter stating she has immune disorder so she can work from home. Please call to advise.

## 2018-12-04 ENCOUNTER — Telehealth: Payer: Self-pay | Admitting: Diagnostic Neuroimaging

## 2018-12-04 NOTE — Telephone Encounter (Signed)
May continue work from home for now. Depends on patient preference also. May re-evaluate in August 2020. -VRP

## 2018-12-04 NOTE — Telephone Encounter (Signed)
Pt called stating that her employer is wanting to know if she can go back to the office for work. Pt would like to know what is advised.

## 2018-12-08 ENCOUNTER — Encounter: Payer: Self-pay | Admitting: *Deleted

## 2018-12-08 NOTE — Telephone Encounter (Signed)
Replied to patient via my chart, advised she needs to call and schedule follow up in August.

## 2018-12-08 NOTE — Telephone Encounter (Signed)
Received my chart from patient requesting a call. Called her and she stated she wanted more clarification of Dr Gladstone Lighter reply. I advised her that he is supporting her if she continues to work from home, however he is letting her make the decision based on her preference and comfort level. She will discuss further with her employer. She works at a nursing home. She  verbalized understanding, appreciation.

## 2018-12-08 NOTE — Telephone Encounter (Signed)
Pt would like to know if this information can be emailed to her. Please advise.

## 2019-02-16 ENCOUNTER — Other Ambulatory Visit: Payer: Self-pay

## 2019-02-16 ENCOUNTER — Encounter: Payer: Self-pay | Admitting: Diagnostic Neuroimaging

## 2019-02-16 ENCOUNTER — Ambulatory Visit (INDEPENDENT_AMBULATORY_CARE_PROVIDER_SITE_OTHER): Payer: Commercial Managed Care - PPO | Admitting: Diagnostic Neuroimaging

## 2019-02-16 VITALS — BP 143/84 | HR 73 | Temp 98.6°F | Ht 65.0 in | Wt 167.0 lb

## 2019-02-16 DIAGNOSIS — G35 Multiple sclerosis: Secondary | ICD-10-CM | POA: Diagnosis not present
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
## 2019-02-18 ENCOUNTER — Telehealth: Payer: Self-pay | Admitting: Diagnostic Neuroimaging

## 2019-02-18 NOTE — Telephone Encounter (Signed)
no to the covid-19 questions MR Brain w/wo contrast Dr. Su Ley Auth: [place] Ref # [id number]. Patient is scheduled at [facility] for 02/25/19.

## 2019-02-18 NOTE — Telephone Encounter (Signed)
Patient called to r/s for 03/10/19.

## 2019-02-25 ENCOUNTER — Other Ambulatory Visit: Payer: Commercial Managed Care - PPO

## 2019-03-10 ENCOUNTER — Other Ambulatory Visit: Payer: Commercial Managed Care - PPO

## 2019-03-23 ENCOUNTER — Telehealth: Payer: Self-pay

## 2019-03-23 NOTE — Telephone Encounter (Signed)
Pt called to cancel her appointment for 9/22 @ 3:00 pm. States she will call back and r/s. Cher Nakai, 03/23/19

## 2019-03-24 ENCOUNTER — Other Ambulatory Visit: Payer: Commercial Managed Care - PPO

## 2019-04-13 ENCOUNTER — Ambulatory Visit: Payer: Commercial Managed Care - PPO | Admitting: Diagnostic Neuroimaging

## 2019-04-15 ENCOUNTER — Telehealth: Payer: Self-pay | Admitting: *Deleted

## 2019-04-15 MED ORDER — GLATIRAMER ACETATE 20 MG/ML ~~LOC~~ SOSY: 20.0000 mg | PREFILLED_SYRINGE | Freq: Every day | SUBCUTANEOUS | 3 refills | Status: DC

## 2019-04-15 NOTE — Telephone Encounter (Signed)
Pt called in and stated that a PA needs to be done. And she is requesting a call back.

## 2019-04-15 NOTE — Telephone Encounter (Signed)
Received fax from [place] re: Copaxone must be obtained through [facility], [id number], f [phone]. Must send new Rx to [facility]. New Rx sent to [facility]. Dawn Buckley

## 2019-04-15 NOTE — Telephone Encounter (Addendum)
Called patient and she gave her new insurance information: Insurance account manager, Rx bin [phone], Rx [phone], Rx PCN: [id number], id [id number], Dawn Buckley. She is brand Copaxone, since 2014; failed Avonex due to hair loss. I advised her I'll do PA today. She takes her last injection today, verbalized understanding, appreciation.  Started PA on CMM, key :[id number].  Your information has been submitted to [place]. To check for an updated outcome later, reopen this PA request from your dashboard. If [employer] has not responded to your request within 24 hours, contact [place] at [phone]

## 2019-04-16 NOTE — Telephone Encounter (Addendum)
Received notification on CMM: Copaxone approved 04/15/2019 - 04/14/2020. Called patient and advised her; she will call right away for refill and request it be overnighted to her. She verbalized understanding, appreciation.

## 2019-04-27 NOTE — Telephone Encounter (Signed)
Received fax form [facility], re: Copaxone PFS 20 mg/mL shipped on 04/21/2019.

## 2019-06-29 ENCOUNTER — Telehealth: Payer: Self-pay | Admitting: Diagnostic Neuroimaging

## 2019-06-29 NOTE — Telephone Encounter (Signed)
Patient called to get more information on whether she is approved for getting the COVID vaccine due to having MS.please follow up.

## 2019-06-30 NOTE — Telephone Encounter (Signed)
Called patient and informed her of Dr Dohmeier's reply. She  verbalized understanding, appreciation.

## 2019-06-30 NOTE — Telephone Encounter (Signed)
Yes, the Covid 19 vaccine is recommended for MS patients, too-, it is not a live virus vaccine.

## 2019-09-15 ENCOUNTER — Telehealth: Payer: Self-pay | Admitting: Diagnostic Neuroimaging

## 2019-09-15 DIAGNOSIS — Z833 Family history of diabetes mellitus: Secondary | ICD-10-CM | POA: Diagnosis not present

## 2019-09-15 DIAGNOSIS — Z13220 Encounter for screening for lipoid disorders: Secondary | ICD-10-CM | POA: Diagnosis not present

## 2019-09-15 DIAGNOSIS — E559 Vitamin D deficiency, unspecified: Secondary | ICD-10-CM | POA: Diagnosis not present

## 2019-09-15 DIAGNOSIS — G35 Multiple sclerosis: Secondary | ICD-10-CM | POA: Diagnosis not present

## 2019-09-15 DIAGNOSIS — R5383 Other fatigue: Secondary | ICD-10-CM | POA: Diagnosis not present

## 2019-09-15 NOTE — Telephone Encounter (Signed)
Patient called stating she has been feeling very fatigue and would like to know if she can be called something in and requested a CB to discuss with RN

## 2019-09-15 NOTE — Telephone Encounter (Signed)
Spoke with patient who stated she had to leave work early a few times due to fatigue, extreme tiredness. She was so tired she had difficulty walking up stairs. She has no other complains. She take B12 but not daily. I advised she call PCP to be evaluated and rule out infection. She will call back if needed,  verbalized understanding, appreciation.

## 2019-10-26 ENCOUNTER — Telehealth: Payer: Self-pay | Admitting: *Deleted

## 2019-10-26 NOTE — Telephone Encounter (Signed)
Received fax form [facility]: Copaxone 20 mg /mL  PFS shipped on 10/20/19.

## 2019-12-01 DIAGNOSIS — F411 Generalized anxiety disorder: Secondary | ICD-10-CM | POA: Diagnosis not present

## 2019-12-01 DIAGNOSIS — F332 Major depressive disorder, recurrent severe without psychotic features: Secondary | ICD-10-CM | POA: Diagnosis not present

## 2020-01-12 DIAGNOSIS — Z1329 Encounter for screening for other suspected endocrine disorder: Secondary | ICD-10-CM | POA: Diagnosis not present

## 2020-01-12 DIAGNOSIS — Z13220 Encounter for screening for lipoid disorders: Secondary | ICD-10-CM | POA: Diagnosis not present

## 2020-01-12 DIAGNOSIS — Z6830 Body mass index (BMI) 30.0-30.9, adult: Secondary | ICD-10-CM | POA: Diagnosis not present

## 2020-01-12 DIAGNOSIS — Z1231 Encounter for screening mammogram for malignant neoplasm of breast: Secondary | ICD-10-CM | POA: Diagnosis not present

## 2020-01-12 DIAGNOSIS — Z01419 Encounter for gynecological examination (general) (routine) without abnormal findings: Secondary | ICD-10-CM | POA: Diagnosis not present

## 2020-01-12 DIAGNOSIS — Z833 Family history of diabetes mellitus: Secondary | ICD-10-CM | POA: Diagnosis not present

## 2020-01-12 DIAGNOSIS — Z130 Encounter for screening for diseases of the blood and blood-forming organs and certain disorders involving the immune mechanism: Secondary | ICD-10-CM | POA: Diagnosis not present

## 2020-01-12 DIAGNOSIS — Z0000 Encounter for general adult medical examination without abnormal findings: Secondary | ICD-10-CM | POA: Diagnosis not present

## 2020-01-22 DIAGNOSIS — F411 Generalized anxiety disorder: Secondary | ICD-10-CM | POA: Diagnosis not present

## 2020-01-22 DIAGNOSIS — F332 Major depressive disorder, recurrent severe without psychotic features: Secondary | ICD-10-CM | POA: Diagnosis not present

## 2020-01-22 DIAGNOSIS — R030 Elevated blood-pressure reading, without diagnosis of hypertension: Secondary | ICD-10-CM | POA: Diagnosis not present

## 2020-02-17 ENCOUNTER — Telehealth: Payer: Self-pay | Admitting: *Deleted

## 2020-02-17 ENCOUNTER — Ambulatory Visit: Payer: Commercial Managed Care - PPO | Admitting: Diagnostic Neuroimaging

## 2020-02-17 ENCOUNTER — Encounter: Payer: Self-pay | Admitting: Diagnostic Neuroimaging

## 2020-02-17 NOTE — Telephone Encounter (Signed)
Patient was no show for follow up today. 

## 2020-03-02 ENCOUNTER — Telehealth: Payer: Self-pay | Admitting: *Deleted

## 2020-03-02 NOTE — Telephone Encounter (Signed)
LVM requesting she call and schedule FU; she missed FU on 02/17/20. Advised it can be in office or my chart VV. Left office #.

## 2020-04-14 ENCOUNTER — Telehealth: Payer: Self-pay | Admitting: *Deleted

## 2020-04-14 ENCOUNTER — Encounter: Payer: Self-pay | Admitting: *Deleted

## 2020-04-14 NOTE — Telephone Encounter (Signed)
Copaxone (glatiramer acetate) PA, key: [id number]. PA sent to [facility] plan. Called patient, LVM requesting she call back and schedule FU, may be my chart VV if she is doing well. Sent her my chart as well.

## 2020-04-14 NOTE — Telephone Encounter (Signed)
Per CMM: Copaxone (glatiramer ) Approved  Case [id number], Coverage Start Date:03/15/2020 ;Coverage End Date:04/14/2021

## 2020-06-06 ENCOUNTER — Telehealth: Payer: Self-pay | Admitting: Family Medicine

## 2020-07-08 ENCOUNTER — Telehealth: Payer: Self-pay | Admitting: Diagnostic Neuroimaging

## 2020-07-08 NOTE — Telephone Encounter (Signed)
FYI: Pt called, cancelled appt 07/12/20 due to Covid exposure at work. Rescheduled appt for 08/03/20 at 2p.

## 2020-07-11 ENCOUNTER — Ambulatory Visit: Payer: Self-pay | Admitting: Diagnostic Neuroimaging

## 2020-08-03 ENCOUNTER — Other Ambulatory Visit: Payer: Self-pay

## 2020-08-03 ENCOUNTER — Encounter: Payer: Self-pay | Admitting: Diagnostic Neuroimaging

## 2020-08-03 ENCOUNTER — Telehealth: Payer: Self-pay | Admitting: *Deleted

## 2020-08-03 ENCOUNTER — Ambulatory Visit (INDEPENDENT_AMBULATORY_CARE_PROVIDER_SITE_OTHER): Payer: Commercial Managed Care - PPO | Admitting: Diagnostic Neuroimaging

## 2020-08-03 VITALS — BP 131/90 | HR 86 | Ht 64.0 in | Wt 156.2 lb

## 2020-08-03 DIAGNOSIS — G35 Multiple sclerosis: Secondary | ICD-10-CM | POA: Diagnosis not present

## 2020-08-03 MED ORDER — COPAXONE 20 MG/ML ~~LOC~~ SOSY: 20.0000 mg | PREFILLED_SYRINGE | Freq: Every day | SUBCUTANEOUS | 4 refills | Status: DC

## 2020-08-03 NOTE — Telephone Encounter (Signed)
Copaxone brand PA on CMM, key:  [id number], [id number]. [facility] is reviewing your PA request and will respond within 24 hours for Medicaid or up to 72 hours for non-Medicaid plans, based on the required timeframe determined by state or federal regulations.
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
## 2020-08-04 ENCOUNTER — Telehealth: Payer: Self-pay | Admitting: Diagnostic Neuroimaging

## 2020-08-04 NOTE — Telephone Encounter (Signed)
LVM for pt to call back about scheduling MRI  [facility] auth: [id number] (exp. 08/04/20 to 09/02/20)

## 2020-08-04 NOTE — Telephone Encounter (Signed)
Patient returned my call she is scheduled at [facility] for 08/17/20. 

## 2020-08-08 ENCOUNTER — Encounter: Payer: Self-pay | Admitting: *Deleted

## 2020-08-08 NOTE — Telephone Encounter (Signed)
Copaxone appeal letter faxed to clinical appeals dept, [phone]. Decision within 2 weeks.

## 2020-08-08 NOTE — Telephone Encounter (Signed)
Received fax from [facility]: Copaxone 20 mg denied, preferred medications are generic glatiramer injection and dimethyl fumarate DR capsules. Will notify MD and patient via my chart. Appeal letter on MD desk for review, signature.

## 2020-08-11 ENCOUNTER — Encounter: Payer: Self-pay | Admitting: *Deleted

## 2020-08-11 MED ORDER — GLATIRAMER ACETATE 40 MG/ML ~~LOC~~ SOSY: 40.0000 mg | PREFILLED_SYRINGE | SUBCUTANEOUS | 12 refills | Status: DC

## 2020-08-11 NOTE — Telephone Encounter (Signed)
Called patient and advised her of insurance denial of Copaxone. I advised Dr Marjory Lies is switching to generic copaxone; three times a week dosing. He had discussed possibility with her at last visit.  I advised it will require a PA but should go through quickly as it is preferred drug. PA will be started today. Patient verbalized understanding, appreciation.

## 2020-08-11 NOTE — Telephone Encounter (Addendum)
Started Glatiramer acetate PA, key: [id number], received message: preferred is GLATIRAMER(COPAXONE)PRFLD SYR 40MG . Per Dr switch to insurance preferred. Unable to complete PA on CMM.  Called [facility] PA dept, spoke with Marjory Lies to initiate PA on glatiramer 40 mg syr.  Answered clinical questions.  Approved until 08/11/2021. 10/09/2021 Everlean Cherry Sent patient my chart to inform, advised her Rx is Glatiramer 40 mg, will be an injection every other day.

## 2020-08-11 NOTE — Addendum Note (Signed)
Addended by: Joycelyn Schmid R on: 08/11/2020 09:05 AM   Modules accepted: Orders

## 2020-08-11 NOTE — Telephone Encounter (Signed)
Received fax from [facility], brand Copaxone denied. Must trial step one therapy: glatiramer injection or dimethyl fumarate DR capsule.

## 2020-08-11 NOTE — Telephone Encounter (Signed)
Meds ordered this encounter  Medications  . Glatiramer Acetate 40 MG/ML SOSY    Sig: Inject 40 mg into the skin 3 (three) times a week.    Dispense:  12 mL    Refill:  12     Suanne Marker, MD 08/11/2020, 9:04 AM Certified in Neurology, Neurophysiology and Neuroimaging  [facility] [address], [address] [place], [place] [address] [phone]

## 2020-08-17 ENCOUNTER — Ambulatory Visit: Payer: Commercial Managed Care - PPO

## 2020-08-17 DIAGNOSIS — G35 Multiple sclerosis: Secondary | ICD-10-CM | POA: Diagnosis not present

## 2020-08-25 ENCOUNTER — Encounter: Payer: Self-pay | Admitting: *Deleted

## 2020-09-11 ENCOUNTER — Telehealth: Payer: Self-pay | Admitting: Neurology

## 2020-09-11 NOTE — Telephone Encounter (Signed)
Hi Dawn Buckley,  I received a call from an independent medical reviewer on a Sunday (???) , Dr Neldon Newport would like to speak with Dr. Marjory Lies-  I asked him to contact you on Monday  within regular office hours.  It is about coverage for brand name MS drug versus generic.  Dawn Buckley

## 2020-09-12 ENCOUNTER — Encounter: Payer: Self-pay | Admitting: *Deleted

## 2020-09-12 NOTE — Telephone Encounter (Signed)
Received fax from [employer] dept: Copaxone brand is not authorized without first trialing generic Copaxone and dimethyl fumarate. There is no evidence that patient could not safely be trialed on generic Copaxone. Called patient, LVM informing her that her insurance denied Copaxone; we sent in appeal, today the fax is from another drug claim determination service which has denied copaxone again. Asked her to call and let me know what med she is actually taking because MD sent in Rx for glatiramer acetate on 08/12/20 after Copaxone was denied.

## 2020-09-12 NOTE — Telephone Encounter (Signed)
According to her records she is on glatiramer acetate. See note on 08/03/2020: Called [employer] [facility] PA dept, spoke with Ethelene Browns to initiate PA on glatiramer 40 mg syr.  Answered clinical questions.  Approved until 08/11/2021. Everlean Cherry #[phone]

## 2020-09-20 ENCOUNTER — Telehealth: Payer: Self-pay | Admitting: Diagnostic Neuroimaging

## 2020-09-20 MED ORDER — CYCLOBENZAPRINE HCL 5 MG PO TABS: 5.0000 mg | ORAL_TABLET | Freq: Three times a day (TID) | ORAL | 1 refills | Status: DC | PRN

## 2020-09-20 MED ORDER — GABAPENTIN 100 MG PO CAPS: 100.0000 mg | ORAL_CAPSULE | Freq: Three times a day (TID) | ORAL | 3 refills | Status: DC

## 2020-09-20 NOTE — Telephone Encounter (Signed)
Meds ordered this encounter  Medications  . gabapentin (NEURONTIN) 100 MG capsule    Sig: Take 1 capsule (100 mg total) by mouth 3 (three) times daily.    Dispense:  90 capsule    Refill:  3  . cyclobenzaprine (FLEXERIL) 5 MG tablet    Sig: Take 1 tablet (5 mg total) by mouth every 8 (eight) hours as needed for muscle spasms.    Dispense:  30 tablet    Refill:  1    Suanne Marker, MD 09/20/2020, 10:34 AM Certified in Neurology, Neurophysiology and Neuroimaging  [facility] [address] [place], [place] [address] [phone]

## 2020-09-20 NOTE — Telephone Encounter (Signed)
Called patient and LVM informing her of new Rx, gave details. Advised she call back if she does not get relief.

## 2020-09-20 NOTE — Telephone Encounter (Signed)
Called patient who stated symptoms started yesterday. She traveled 3 1/2 hours to see her mom in hospital over weekend. She stated gabapentin usually works for head burning/tingling sensation, but she doesn't have any. She has back spasms as well. She feels this may have all come on from traveling and the stress over her mother's condition. I advised will let MD know and call her back. Patient verbalized understanding, appreciation.

## 2020-09-20 NOTE — Telephone Encounter (Signed)
Pt called, having a MS flare up, weakness, stiffness, head tremors, back spasm; started 6:30p yesterday. Could you call in something to help me?  Would like call from the nurse.

## 2021-04-03 ENCOUNTER — Encounter: Payer: Self-pay | Admitting: *Deleted

## 2021-04-03 ENCOUNTER — Telehealth: Payer: Self-pay | Admitting: Diagnostic Neuroimaging

## 2021-04-03 NOTE — Telephone Encounter (Signed)
Called patient who stated she's having intermittent headaches, saw urgent care yesterday morning, was given a steroid injection and flexeril. She took 2 doses yesterday but had issue sleeping, then woke with headache around midnight. She has tried Ibuprofen and tylenol which eases it but headache returns. She denies any other symptoms, problems. She has not seen PCP for this, was advised by Urgent Care to contact her neurologist. I advised will let Dr Marjory Lies know, and she may need work up by PCP, will send her my chart with Dr Richrd Humbles recommendations. Patient verbalized understanding, appreciation.

## 2021-04-03 NOTE — Telephone Encounter (Signed)
Pt called stating that she has been having headaches for the past two weeks and is wanting to know if this has to do with her MS. Please advise.

## 2021-08-07 ENCOUNTER — Ambulatory Visit: Payer: Commercial Managed Care - PPO | Admitting: Diagnostic Neuroimaging

## 2021-08-08 ENCOUNTER — Encounter: Payer: Self-pay | Admitting: *Deleted

## 2021-08-09 ENCOUNTER — Encounter: Payer: Self-pay | Admitting: Diagnostic Neuroimaging

## 2022-03-23 ENCOUNTER — Encounter: Payer: Self-pay | Admitting: Gastroenterology

## 2022-04-06 ENCOUNTER — Ambulatory Visit (AMBULATORY_SURGERY_CENTER): Payer: Self-pay

## 2022-04-06 VITALS — Ht 64.0 in | Wt 187.0 lb

## 2022-04-06 DIAGNOSIS — Z1211 Encounter for screening for malignant neoplasm of colon: Secondary | ICD-10-CM

## 2022-04-06 MED ORDER — NA SULFATE-K SULFATE-MG SULF 17.5-3.13-1.6 GM/177ML PO SOLN: 1.0000 | ORAL | 0 refills | Status: AC

## 2022-04-06 NOTE — Progress Notes (Signed)
No egg or soy allergy known to patient  No issues known to pt with past sedation with any surgeries or procedures Patient denies ever being told they had issues or difficulty with intubation  No FH of Malignant Hyperthermia Pt is not on diet pills Pt is not on  home 02  Pt is not on blood thinners  Pt notes some issues with constipation and bloating No A fib or A flutter Have any cardiac testing pending--denied Pt instructed to use [email or web address] or GoodRx for a price reduction on prep

## 2022-04-10 ENCOUNTER — Telehealth: Payer: Self-pay | Admitting: Gastroenterology

## 2022-04-10 NOTE — Telephone Encounter (Signed)
Patient called in and would like to know if she can do a cologuard instead of the colonoscopy. She's currently scheduled with Dr. Tarri Glenn in the [facility] on 04/27/22. No current symptoms, this would be patient's first colonoscopy for screening.

## 2022-04-10 NOTE — Telephone Encounter (Signed)
Spoke with patient regarding MD recommendations. Pt verbalized all understanding. 

## 2022-04-10 NOTE — Telephone Encounter (Signed)
Left message for patient to call back  

## 2022-04-10 NOTE — Telephone Encounter (Signed)
Patient called would like to discuss getting a cologuard kit instead of proceeding with the colonoscopy. Would like to discuss further with a nurse. 

## 2022-04-27 ENCOUNTER — Encounter: Payer: Self-pay | Admitting: Gastroenterology

## 2023-10-24 ENCOUNTER — Emergency Department (HOSPITAL_COMMUNITY)
Admission: EM | Admit: 2023-10-24 | Discharge: 2023-10-25 | Disposition: A | Discharge status: Home / Self Care | Attending: Emergency Medicine | Admitting: Emergency Medicine

## 2023-10-24 ENCOUNTER — Other Ambulatory Visit: Payer: Self-pay

## 2023-10-24 ENCOUNTER — Encounter (HOSPITAL_COMMUNITY): Payer: Self-pay | Admitting: Emergency Medicine

## 2023-10-24 ENCOUNTER — Emergency Department (HOSPITAL_COMMUNITY)

## 2023-10-24 DIAGNOSIS — I10 Essential (primary) hypertension: Secondary | ICD-10-CM | POA: Diagnosis not present

## 2023-10-24 DIAGNOSIS — Z79899 Other long term (current) drug therapy: Secondary | ICD-10-CM | POA: Insufficient documentation

## 2023-10-24 DIAGNOSIS — R002 Palpitations: Secondary | ICD-10-CM

## 2023-10-24 LAB — BASIC METABOLIC PANEL WITH GFR
Anion gap: 9 (ref 5–15)
BUN: 12 mg/dL (ref 6–20)
CO2: 24 mmol/L (ref 22–32)
Calcium: 9 mg/dL (ref 8.9–10.3)
Chloride: 103 mmol/L (ref 98–111)
Creatinine, Ser: 0.71 mg/dL (ref 0.44–1.00)
GFR, Estimated: >60 mL/min (ref >60)
Glucose, Bld: 100 mg/dL — ABNORMAL HIGH (ref 70–99)
Potassium: 3.4 mmol/L — ABNORMAL LOW (ref 3.5–5.1)
Sodium: 136 mmol/L (ref 135–145)

## 2023-10-24 LAB — CBC
HCT: 40.5 % (ref 36.0–46.0)
Hemoglobin: 13.1 g/dL (ref 12.0–15.0)
MCH: 28.1 pg (ref 26.0–34.0)
MCHC: 32.3 g/dL (ref 30.0–36.0)
MCV: 86.9 fL (ref 80.0–100.0)
Platelets: 297 10*3/uL (ref 150–400)
RBC: 4.66 MIL/uL (ref 3.87–5.11)
RDW: 12.9 % (ref 11.5–15.5)
WBC: 7.8 10*3/uL (ref 4.0–10.5)
nRBC: 0 % (ref 0.0–0.2)

## 2023-10-24 LAB — TROPONIN I (HIGH SENSITIVITY): Troponin I (High Sensitivity): 10 ng/L (ref <18)

## 2023-10-24 NOTE — ED Triage Notes (Signed)
 Pt in from home with cp and HTN, systolic reportedly 220 PTA. Pt has been having blurred vision and higher than normal bp's since 4/18. Pt has been monitoring her VS at home and pressures remain elevated. Takes no bp meds, only new med is Lexapro (started yesterday). Cp began 1hr ago

## 2023-10-25 MED ORDER — AMLODIPINE BESYLATE 5 MG PO TABS: 5.0000 mg | ORAL_TABLET | Freq: Every day | ORAL | 0 refills | Status: AC

## 2023-10-25 NOTE — ED Provider Notes (Signed)
 [place] EMERGENCY DEPARTMENT AT [facility] Provider Note   CSN: [id number] Arrival date & time: 10/24/23  2300     History  Chief Complaint  Patient presents with   Chest Pain   Hypertension    Dawn Buckley is a 48 y.o. female.  The history is provided by the patient.   Patient presents for evaluation of hypertension.  Patient reports she was at work recently and noticed she had blurred vision.  At that time she had her blood pressure checked and it was elevated.  She has seen her PCP who  advised frequent monitoring at home but no new medicines have been started.  She has also been seen in urgent care for her high blood pressure.  Tonight she checked her blood pressure in the evening and noted it was still high and she felt her heart fluttering.  No fevers or vomiting.  No chest pain or shortness of breath.  No focal arm or leg weakness.  She can ambulate without difficulty.  No slurred speech.  No previous history of CVA/CAD.  She does report a history of MS  No history of chest pain rating to the back  Home Medications Prior to Admission medications   Medication Sig Start Date End Date Taking? Authorizing Provider  amLODipine (NORVASC) 5 MG tablet Take 1 tablet (5 mg total) by mouth daily. 10/25/23  Yes Eldon Greenland, MD  desogestrel-ethinyl estradiol (KARIVA,AZURETTE,MIRCETTE) 0.15-0.02/0.01 MG (21/5) tablet Take 1 tablet by mouth daily.    [provider]  Na Sulfate-K Sulfate-Mg Sulf 17.5-3.13-1.6 GM/177ML SOLN Take 1 kit by mouth as directed. May use generic Suprep, no prior authorization. Take as directed. 04/06/22   Lindle Rhea, MD      Allergies    Sulfa antibiotics    Review of Systems   Review of Systems  Constitutional:  Positive for fatigue.  Eyes:  Negative for visual disturbance.  Respiratory:  Negative for shortness of breath.   Cardiovascular:  Positive for palpitations. Negative for chest pain.  Gastrointestinal:  Negative  for abdominal pain and vomiting.  Musculoskeletal:  Negative for back pain.  Neurological:  Negative for dizziness, speech difficulty, weakness, numbness and headaches.  Psychiatric/Behavioral:  Negative for confusion.     Physical Exam Updated Vital Signs BP (!) 151/76   Pulse 69   Temp 98.2 F (36.8 C) (Oral)   Resp 16   Ht 1.626 m (5\' 4" )   Wt 84.8 kg   SpO2 99%   BMI 32.09 kg/m  Physical Exam CONSTITUTIONAL: Well developed/well nourished HEAD: Normocephalic/atraumatic EYES: EOMI/PERRL, no nystagmus,  no ptosis ENMT: Mucous membranes moist NECK: supple no meningeal signs, no bruits CV: S1/S2 noted, no murmurs/rubs/gallops noted LUNGS: Lungs are clear to auscultation bilaterally, no apparent distress ABDOMEN: soft, nontender, no rebound or guarding GU:no cva tenderness NEURO:Awake/alert, face symmetric, no arm or leg drift is noted Equal 5/5 strength with shoulder abduction, elbow flex/extension, wrist flex/extension in upper extremities and equal hand grips bilaterally Equal 5/5 strength with hip flexion,knee flex/extension, foot dorsi/plantar flexion Cranial nerves 3/4/5/6/01/07/09/11/12 tested and intact Gait normal without ataxia No past pointing Sensation to light touch intact in all extremities EXTREMITIES: pulses normal, full ROM SKIN: warm, color normal PSYCH: no abnormalities of mood noted  ED Results / Procedures / Treatments   Labs (all labs ordered are listed, but only abnormal results are displayed) Labs Reviewed  BASIC METABOLIC PANEL WITH GFR - Abnormal; Notable for the following components:  Result Value   Potassium 3.4 (*)    Glucose, Bld 100 (*)    All other components within normal limits  CBC  TROPONIN I (HIGH SENSITIVITY)    EKG EKG Interpretation Date/Time:  Thursday October 24 2023 23:08:46 EDT Ventricular Rate:  72 PR Interval:  167 QRS Duration:  89 QT Interval:  394 QTC Calculation: 432 R Axis:   42  Text Interpretation: Sinus  rhythm No significant change since last tracing Confirmed by Eldon Greenland ([id number]) on 10/25/2023 12:10:17 AM  Radiology DG Chest 2 View Result Date: 10/24/2023 CLINICAL DATA:  cp EXAM: CHEST - 2 VIEW COMPARISON:  04/11/2013. FINDINGS: Cardiac silhouette is unremarkable. No pneumothorax or pleural effusion. The lungs are clear. The visualized skeletal structures are unremarkable. IMPRESSION: No acute cardiopulmonary process. Electronically Signed   By: Sydell Eva M.D.   On: 10/24/2023 23:46    Procedures Procedures    Medications Ordered in ED Medications - No data to display  ED Course/ Medical Decision Making/ A&P                                 Medical Decision Making Amount and/or Complexity of Data Reviewed Labs: ordered. Radiology: ordered.  Risk Prescription drug management.   This patient presents to the ED for concern of palpitations, elevated blood pressure, this involves an extensive number of treatment options, and is a complaint that carries with it a high risk of complications and morbidity.  The differential diagnosis includes but is not limited to acute coronary syndrome, aortic dissection, pulmonary embolism, pericarditis, pneumothorax, pneumonia, myocarditis, pleurisy, esophageal rupture, SVT, ventricular tachycardia, atrial flutter, atrial fibrillation   Comorbidities that complicate the patient evaluation: Patient's presentation is complicated by their history of MS  Additional history obtained: Records reviewed  outpatient records reviewed  Lab Tests: I Ordered, and personally interpreted labs.  The pertinent results include: Labs reassuring  Imaging Studies ordered: I ordered imaging studies including X-ray chest   I independently visualized and interpreted imaging which showed no acute findings I agree with the radiologist interpretation  Reevaluation: After the interventions noted above, I reevaluated the patient and found that they have  :improved  Complexity of problems addressed: Patient's presentation is most consistent with  acute presentation with potential threat to life or bodily function  Disposition: After consideration of the diagnostic results and the patient's response to treatment,  I feel that the patent would benefit from discharge   .    Patient reports she felt her heart fluttering and noted her blood pressure was still elevated.  It is now improving spontaneously.  However we will start Norvasc and she will follow with her PCP.  No signs of hypertensive emergency at this time. Patient safe for discharge.  We discussed strict return precautions       Final Clinical Impression(s) / ED Diagnoses Final diagnoses:  Primary hypertension  Palpitations    Rx / DC Orders ED Discharge Orders          Ordered    amLODipine (NORVASC) 5 MG tablet  Daily        10/25/23 0056              Eldon Greenland, MD 10/25/23 [phone]
